# Patient Record
Sex: Female | Born: 1975 | Race: Black or African American | Hispanic: No | Smoking: Current every day smoker
Health system: Southern US, Community
[De-identification: ages and names within clinical notes are randomized; demographics above are authoritative.]

## PROBLEM LIST (undated history)

## (undated) DIAGNOSIS — L732 Hidradenitis suppurativa: Secondary | ICD-10-CM

---

## 2005-03-24 ENCOUNTER — Emergency Department (HOSPITAL_COMMUNITY): Admission: EM | Admit: 2005-03-24 | Discharge: 2005-03-24 | Payer: Self-pay | Admitting: Emergency Medicine

## 2005-10-03 ENCOUNTER — Emergency Department (HOSPITAL_COMMUNITY): Admission: EM | Admit: 2005-10-03 | Discharge: 2005-10-04 | Payer: Self-pay | Admitting: Emergency Medicine

## 2005-10-05 ENCOUNTER — Emergency Department (HOSPITAL_COMMUNITY): Admission: EM | Admit: 2005-10-05 | Discharge: 2005-10-05 | Payer: Self-pay | Admitting: Emergency Medicine

## 2006-06-23 ENCOUNTER — Emergency Department (HOSPITAL_COMMUNITY): Admission: EM | Admit: 2006-06-23 | Discharge: 2006-06-23 | Payer: Self-pay | Admitting: Emergency Medicine

## 2006-08-08 ENCOUNTER — Emergency Department (HOSPITAL_COMMUNITY): Admission: EM | Admit: 2006-08-08 | Discharge: 2006-08-08 | Payer: Self-pay | Admitting: Emergency Medicine

## 2006-12-11 ENCOUNTER — Emergency Department (HOSPITAL_COMMUNITY): Admission: EM | Admit: 2006-12-11 | Discharge: 2006-12-11 | Payer: Self-pay | Admitting: Emergency Medicine

## 2007-11-16 ENCOUNTER — Emergency Department (HOSPITAL_COMMUNITY): Admission: EM | Admit: 2007-11-16 | Discharge: 2007-11-16 | Payer: Self-pay | Admitting: Family Medicine

## 2008-02-15 ENCOUNTER — Emergency Department (HOSPITAL_COMMUNITY): Admission: EM | Admit: 2008-02-15 | Discharge: 2008-02-15 | Payer: Self-pay | Admitting: Family Medicine

## 2008-02-17 ENCOUNTER — Emergency Department (HOSPITAL_COMMUNITY): Admission: EM | Admit: 2008-02-17 | Discharge: 2008-02-17 | Payer: Self-pay | Admitting: Family Medicine

## 2008-10-17 ENCOUNTER — Emergency Department (HOSPITAL_COMMUNITY): Admission: EM | Admit: 2008-10-17 | Discharge: 2008-10-17 | Payer: Self-pay | Admitting: Emergency Medicine

## 2008-11-09 ENCOUNTER — Emergency Department (HOSPITAL_COMMUNITY): Admission: EM | Admit: 2008-11-09 | Discharge: 2008-11-09 | Payer: Self-pay | Admitting: Emergency Medicine

## 2010-02-04 ENCOUNTER — Emergency Department (HOSPITAL_COMMUNITY): Admission: EM | Admit: 2010-02-04 | Discharge: 2010-01-20 | Payer: Self-pay | Admitting: Emergency Medicine

## 2010-04-26 ENCOUNTER — Emergency Department (HOSPITAL_COMMUNITY)
Admission: EM | Admit: 2010-04-26 | Discharge: 2010-04-27 | Discharge status: Against Medical Advice | Payer: Self-pay | Attending: Emergency Medicine | Admitting: Emergency Medicine

## 2010-04-26 DIAGNOSIS — Z0389 Encounter for observation for other suspected diseases and conditions ruled out: Secondary | ICD-10-CM | POA: Insufficient documentation

## 2010-06-04 LAB — URINE MICROSCOPIC-ADD ON

## 2010-06-04 LAB — COMPREHENSIVE METABOLIC PANEL
AST: 17 U/L (ref 0–37)
Alkaline Phosphatase: 53 U/L (ref 39–117)
CO2: 25 mEq/L (ref 19–32)
Calcium: 8.6 mg/dL (ref 8.4–10.5)
GFR calc Af Amer: >60 mL/min (ref >60)
GFR calc non Af Amer: >60 mL/min (ref >60)
Glucose, Bld: 100 mg/dL — ABNORMAL HIGH (ref 70–99)
Potassium: 4 mEq/L (ref 3.5–5.1)
Sodium: 136 mEq/L (ref 135–145)

## 2010-06-04 LAB — URINALYSIS, ROUTINE W REFLEX MICROSCOPIC
Glucose, UA: NEGATIVE mg/dL
Hgb urine dipstick: NEGATIVE
Nitrite: NEGATIVE
pH: 7 (ref 5.0–8.0)

## 2010-06-04 LAB — DIFFERENTIAL
Basophils Relative: 1 % (ref 0–1)
Lymphs Abs: 1.3 10*3/uL (ref 0.7–4.0)
Monocytes Absolute: 0.4 10*3/uL (ref 0.1–1.0)
Neutro Abs: 4.1 10*3/uL (ref 1.7–7.7)
Neutrophils Relative %: 70 % (ref 43–77)

## 2010-06-04 LAB — CBC
MCHC: 33.7 g/dL (ref 30.0–36.0)
RBC: 4.42 MIL/uL (ref 3.87–5.11)

## 2010-06-04 LAB — POCT PREGNANCY, URINE: Preg Test, Ur: NEGATIVE

## 2010-06-05 LAB — URINE MICROSCOPIC-ADD ON

## 2010-06-05 LAB — URINALYSIS, ROUTINE W REFLEX MICROSCOPIC
Nitrite: NEGATIVE
Protein, ur: NEGATIVE mg/dL
Urobilinogen, UA: 1 mg/dL (ref 0.0–1.0)

## 2010-06-05 LAB — PREGNANCY, URINE: Preg Test, Ur: NEGATIVE

## 2010-11-09 ENCOUNTER — Inpatient Hospital Stay (INDEPENDENT_AMBULATORY_CARE_PROVIDER_SITE_OTHER)
Admission: RE | Admit: 2010-11-09 | Discharge: 2010-11-09 | Disposition: A | Discharge status: Home / Self Care | Payer: Self-pay | Source: Ambulatory Visit | Attending: Emergency Medicine | Admitting: Emergency Medicine

## 2010-11-09 DIAGNOSIS — L089 Local infection of the skin and subcutaneous tissue, unspecified: Secondary | ICD-10-CM

## 2010-11-09 DIAGNOSIS — L989 Disorder of the skin and subcutaneous tissue, unspecified: Secondary | ICD-10-CM

## 2010-11-29 LAB — GC/CHLAMYDIA PROBE AMP, GENITAL
Chlamydia, DNA Probe: NEGATIVE
GC Probe Amp, Genital: NEGATIVE

## 2010-11-29 LAB — POCT URINALYSIS DIP (DEVICE)
Bilirubin Urine: NEGATIVE
Glucose, UA: NEGATIVE
Hgb urine dipstick: NEGATIVE
Ketones, ur: NEGATIVE
Specific Gravity, Urine: 1.02

## 2010-11-29 LAB — WET PREP, GENITAL
Clue Cells Wet Prep HPF POC: NONE SEEN
Yeast Wet Prep HPF POC: NONE SEEN

## 2010-12-03 LAB — CULTURE, ROUTINE-ABSCESS: Culture: NO GROWTH

## 2010-12-05 ENCOUNTER — Inpatient Hospital Stay (INDEPENDENT_AMBULATORY_CARE_PROVIDER_SITE_OTHER)
Admission: RE | Admit: 2010-12-05 | Discharge: 2010-12-05 | Disposition: A | Discharge status: Home / Self Care | Payer: Self-pay | Source: Ambulatory Visit | Attending: Emergency Medicine | Admitting: Emergency Medicine

## 2010-12-05 DIAGNOSIS — M799 Soft tissue disorder, unspecified: Secondary | ICD-10-CM

## 2010-12-09 LAB — POCT PREGNANCY, URINE: Preg Test, Ur: NEGATIVE

## 2010-12-09 LAB — URINE MICROSCOPIC-ADD ON

## 2010-12-09 LAB — URINALYSIS, ROUTINE W REFLEX MICROSCOPIC
Bilirubin Urine: NEGATIVE
Glucose, UA: NEGATIVE
Ketones, ur: 15 — AB
Specific Gravity, Urine: 1.028

## 2011-08-11 ENCOUNTER — Emergency Department (HOSPITAL_COMMUNITY)
Admission: EM | Admit: 2011-08-11 | Discharge: 2011-08-11 | Disposition: A | Discharge status: Home / Self Care | Payer: Self-pay | Attending: Emergency Medicine | Admitting: Emergency Medicine

## 2011-08-11 ENCOUNTER — Encounter (HOSPITAL_COMMUNITY): Payer: Self-pay | Admitting: Emergency Medicine

## 2011-08-11 DIAGNOSIS — L259 Unspecified contact dermatitis, unspecified cause: Secondary | ICD-10-CM | POA: Insufficient documentation

## 2011-08-11 DIAGNOSIS — R21 Rash and other nonspecific skin eruption: Secondary | ICD-10-CM

## 2011-08-11 MED ORDER — DIPHENHYDRAMINE HCL 25 MG PO TABS: 25.0000 mg | ORAL_TABLET | Freq: Four times a day (QID) | ORAL | Status: AC

## 2011-08-11 MED ORDER — METHYLPREDNISOLONE SODIUM SUCC 125 MG IJ SOLR
125.0000 mg | Freq: Once | INTRAMUSCULAR | Status: AC
  Administered 2011-08-11: 125 mg via INTRAMUSCULAR
  Filled 2011-08-11: qty 2

## 2011-08-11 NOTE — ED Notes (Signed)
PT. REPORTS ITCHY FACIAL RASH FOR 3 DAYS , RESPIRATIONS UNLABORED.

## 2011-08-11 NOTE — ED Notes (Signed)
Pt stated that she was in the ED 3 days ago. After leaving she stated that a rash began on her face and neck. The rash increased over the last few days. No pain, no drainage, no redness or swelling. Only itching. A few bumps. On neck face. Pt applied cream to assist with itching at home. No cardiac or respiratory distress. Will continue to monitor.

## 2011-08-11 NOTE — ED Provider Notes (Signed)
History     CSN: [id number]  Arrival date & time 08/11/11  2044   First MD Initiated Contact with Patient 08/11/11 2244      Chief Complaint  Patient presents with  . Rash   HPI  History provided by the patient. Patient is a 36 year old female with no significant past medical history who presents with complaints of pleuritic rash to bilateral face and neck area for the past 3 days. Symptoms began over the right neck area and chin. Patient now has itching and rash to bilateral cheeks and neck area. Patient is unsure what may have caused rash. She has not had similar rash on the face in the past. Patient does state that is possible she could of gotten it from one of her dogs. 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 Patient has been putting a poison ivy blocking cream on the face without any improvements. She has not done anything else for her symptoms. She denies any swelling of the lips, tongue, throat. She denies any difficulty breathing or swallowing. She denies any swelling around the eyes. She denies rash anywhere else. She denies any new makeup, facial lotions, soaps, clothing or jewelry.    History reviewed. No pertinent past medical history.  History reviewed. No pertinent past surgical history.  No family history on file.  History  Substance Use Topics  . Smoking status: Current Everyday Smoker  . Smokeless tobacco: Not on file  . Alcohol Use: No    OB History    Grav Para Term Preterm Abortions TAB SAB Ect Mult Living                  Review of Systems  Constitutional: Negative for fever and chills.  HENT: Negative for trouble swallowing.   Eyes: Negative for visual disturbance.  Respiratory: Negative for shortness of breath.   Skin: Positive for rash.  Neurological: Negative for headaches.    Allergies  Review of patient's allergies indicates no known allergies.  Home Medications  No current outpatient  prescriptions on file.  BP 82/62  Pulse 91  Temp 98 F (36.7 C) (Oral)  Resp 20  SpO2 99%  LMP 07/31/2011  Physical Exam  Nursing note and vitals reviewed. Constitutional: She is oriented to person, place, and time. She appears well-developed and well-nourished. No distress.  HENT:  Head: Normocephalic and atraumatic.  Neck: Normal range of motion. Neck supple.  Cardiovascular: Normal rate and regular rhythm.   Pulmonary/Chest: Effort normal. No stridor. No respiratory distress. She has wheezes. She has no rales.       Very slight inspiratory wheeze  Neurological: She is alert and oriented to person, place, and time.  Skin: Skin is warm and dry.       Mild erythematous macular papular rash in a regular patches on the right lateral neck, right cheek area, chin and left jaw pain and neck.   Psychiatric: She has a normal mood and affect. Her behavior is normal.    ED Course  Procedures     1. Rash   2. Contact dermatitis       MDM  Patient seen and evaluated. Patient in no acute distress.        Angus Seller, [place] 08/12/11 [phone]

## 2011-08-11 NOTE — Discharge Instructions (Signed)
You were seen and evaluated for your rash on her face. At this time your providers are unsure exactly of the cause of your rash but it is possibly rash could be caused from a contact dermatitis from poison ivy or other substance. Your given one dose of steroids to help with your rash. You may continue to use Benadryl to help with itching symptoms. Please followup with the primary care provider or dermatology specialist for continued evaluation if your symptoms persist or worsen. You develop any difficulty breathing or swallowing, swelling of your lips are common please return to the emergency room.   Contact Dermatitis Contact dermatitis is a reaction to certain substances that touch the skin. Contact dermatitis can be either irritant contact dermatitis or allergic contact dermatitis. Irritant contact dermatitis does not require previous exposure to the substance for a reaction to occur.Allergic contact dermatitis only occurs if you have been exposed to the substance before. Upon a repeat exposure, your body reacts to the substance.  CAUSES  Many substances can cause contact dermatitis. Irritant dermatitis is most commonly caused by repeated exposure to mildly irritating substances, such as:  Makeup.   Soaps.   Detergents.   Bleaches.   Acids.   Metal salts, such as nickel.  Allergic contact dermatitis is most commonly caused by exposure to:  Poisonous plants.   Chemicals (deodorants, shampoos).   Jewelry.   Latex.   Neomycin in triple antibiotic cream.   Preservatives in products, including clothing.  SYMPTOMS  The area of skin that is exposed may develop:  Dryness or flaking.   Redness.   Cracks.   Itching.   Pain or a burning sensation.   Blisters.  With allergic contact dermatitis, there may also be swelling in areas such as the eyelids, mouth, or genitals.  DIAGNOSIS  Your caregiver can usually tell what the problem is by doing a physical exam. In cases where the  cause is uncertain and an allergic contact dermatitis is suspected, a patch skin test may be performed to help determine the cause of your dermatitis. TREATMENT Treatment includes protecting the skin from further contact with the irritating substance by avoiding that substance if possible. Barrier creams, powders, and gloves may be helpful. Your caregiver may also recommend:  Steroid creams or ointments applied 2 times daily. For best results, soak the rash area in cool water for 20 minutes. Then apply the medicine. Cover the area with a plastic wrap. You can store the steroid cream in the refrigerator for a "chilly" effect on your rash. That may decrease itching. Oral steroid medicines may be needed in more severe cases.   Antibiotics or antibacterial ointments if a skin infection is present.   Antihistamine lotion or an antihistamine taken by mouth to ease itching.   Lubricants to keep moisture in your skin.   Burow's solution to reduce redness and soreness or to dry a weeping rash. Mix one packet or tablet of solution in 2 cups cool water. Dip a clean washcloth in the mixture, wring it out a bit, and put it on the affected area. Leave the cloth in place for 30 minutes. Do this as often as possible throughout the day.   Taking several cornstarch or baking soda baths daily if the area is too large to cover with a washcloth.  Harsh chemicals, such as alkalis or acids, can cause skin damage that is like a burn. You should flush your skin for 15 to 20 minutes with cold water after such  an exposure. You should also seek immediate medical care after exposure. Bandages (dressings), antibiotics, and pain medicine may be needed for severely irritated skin.  HOME CARE INSTRUCTIONS  Avoid the substance that caused your reaction.   Keep the area of skin that is affected away from hot water, soap, sunlight, chemicals, acidic substances, or anything else that would irritate your skin.   Do not scratch the  rash. Scratching may cause the rash to become infected.   You may take cool baths to help stop the itching.   Only take over-the-counter or prescription medicines as directed by your caregiver.   See your caregiver for follow-up care as directed to make sure your skin is healing properly.  SEEK MEDICAL CARE IF:   Your condition is not better after 3 days of treatment.   You seem to be getting worse.   You see signs of infection such as swelling, tenderness, redness, soreness, or warmth in the affected area.   You have any problems related to your medicines.  Document Released: 02/12/2000 Document Revised: 02/03/2011 Document Reviewed: 07/20/2010 [facility] Patient Information 2012 [place], [place].   Rash A rash is a change in the color or texture of your skin. There are many different types of rashes. You may have other problems that accompany your rash. CAUSES   Infections.   Allergic reactions. This can include allergies to pets or foods.   Certain medicines.   Exposure to certain chemicals, soaps, or cosmetics.   Heat.   Exposure to poisonous plants.   Tumors, both cancerous and noncancerous.  SYMPTOMS   Redness.   Scaly skin.   Itchy skin.   Dry or cracked skin.   Bumps.   Blisters.   Pain.  DIAGNOSIS  Your caregiver may do a physical exam to determine what type of rash you have. A skin sample (biopsy) may be taken and examined under a microscope. TREATMENT  Treatment depends on the type of rash you have. Your caregiver may prescribe certain medicines. For serious conditions, you may need to see a skin doctor (dermatologist). HOME CARE INSTRUCTIONS   Avoid the substance that caused your rash.   Do not scratch your rash. This can cause infection.   You may take cool baths to help stop itching.   Only take over-the-counter or prescription medicines as directed by your caregiver.   Keep all follow-up appointments as directed by your caregiver.  SEEK  IMMEDIATE MEDICAL CARE IF:  You have increasing pain, swelling, or redness.   You have a fever.   You have new or severe symptoms.   You have body aches, diarrhea, or vomiting.   Your rash is not better after 3 days.  MAKE SURE YOU:  Understand these instructions.   Will watch your condition.   Will get help right away if you are not doing well or get worse.  Document Released: 02/04/2002 Document Revised: 02/03/2011 Document Reviewed: 11/29/2010 [facility] Patient Information 2012 [place], [place].

## 2011-08-12 NOTE — ED Provider Notes (Signed)
Medical screening examination/treatment/procedure(s) were performed by non-physician practitioner and as supervising physician I was immediately available for consultation/collaboration.    Shataya Winkles R Mae Denunzio, MD 08/12/11 2110 

## 2012-05-13 ENCOUNTER — Emergency Department (HOSPITAL_COMMUNITY)
Admission: EM | Admit: 2012-05-13 | Discharge: 2012-05-14 | Disposition: A | Discharge status: Home / Self Care | Payer: Self-pay | Attending: Emergency Medicine | Admitting: Emergency Medicine

## 2012-05-13 ENCOUNTER — Encounter (HOSPITAL_COMMUNITY): Payer: Self-pay

## 2012-05-13 DIAGNOSIS — L02215 Cutaneous abscess of perineum: Secondary | ICD-10-CM

## 2012-05-13 DIAGNOSIS — Z872 Personal history of diseases of the skin and subcutaneous tissue: Secondary | ICD-10-CM | POA: Insufficient documentation

## 2012-05-13 DIAGNOSIS — L02219 Cutaneous abscess of trunk, unspecified: Secondary | ICD-10-CM | POA: Insufficient documentation

## 2012-05-13 DIAGNOSIS — L03319 Cellulitis of trunk, unspecified: Secondary | ICD-10-CM | POA: Insufficient documentation

## 2012-05-13 DIAGNOSIS — F17200 Nicotine dependence, unspecified, uncomplicated: Secondary | ICD-10-CM | POA: Insufficient documentation

## 2012-05-13 HISTORY — DX: Hidradenitis suppurativa: L73.2

## 2012-05-13 NOTE — ED Provider Notes (Signed)
History     CSN: [id number]  Arrival date & time 05/13/12  2302   First MD Initiated Contact with Patient 05/13/12 2330      No chief complaint on file.   (Consider location/radiation/quality/duration/timing/severity/associated sxs/prior treatment) HPI Comments: 37 year old female with a history of hidradenitis presents to the emergency department complaining of a "boil to her vagina" that she noticed 3 days ago. The area has been getting larger over the past few days and she does notice some discharge from it causing an odor. Denies vaginal discharge, pain, bleeding or menstrual problem. Denies increased urinary frequency, urgency, dysuria or hematuria. No fever or chills. States she normally gets abscesses throughout her body, however has never had one in this area. Despite triage summary patient is not concerned about sexually transmitted diseases. She is sexually active with one partner and always uses condoms.  The history is provided by the patient.    Past Medical History  Diagnosis Date  . Hidradenitis     History reviewed. No pertinent past surgical history.  No family history on file.  History  Substance Use Topics  . Smoking status: Current Every Day Smoker -- 0.50 packs/day    Types: Cigarettes  . Smokeless tobacco: Never Used  . Alcohol Use: Yes    OB History   Grav Para Term Preterm Abortions TAB SAB Ect Mult Living                  Review of Systems  Constitutional: Negative for fever and chills.  Gastrointestinal: Negative for nausea, vomiting and anal bleeding.  Genitourinary: Negative for dysuria, urgency, frequency, hematuria, vaginal bleeding, vaginal discharge, vaginal pain and menstrual problem.  Skin:       Positive for "boil"  All other systems reviewed and are negative.    Allergies  Review of patient's allergies indicates no known allergies.  Home Medications   Current Outpatient Rx  Name  Route  Sig  Dispense  Refill  . EXPIRED:  diphenhydrAMINE (BENADRYL) 25 MG tablet   Oral   Take 1 tablet (25 mg total) by mouth every 6 (six) hours.   20 tablet   0     BP 135/89  Pulse 80  Temp(Src) 98.2 F (36.8 C) (Oral)  Resp 16  SpO2 99%  LMP 05/05/2012  Physical Exam  Nursing note and vitals reviewed. Constitutional: She is oriented to person, place, and time. She appears well-developed and well-nourished. No distress.  HENT:  Head: Normocephalic and atraumatic.  Mouth/Throat: Oropharynx is clear and moist.  Eyes: Conjunctivae are normal.  Neck: Normal range of motion. Neck supple.  Cardiovascular: Normal rate, regular rhythm and normal heart sounds.   Pulmonary/Chest: Effort normal and breath sounds normal. No respiratory distress.  Abdominal: Soft. Bowel sounds are normal. There is no tenderness.  Genitourinary:    There is no rash, tenderness or lesion on the right labia. There is no rash, tenderness or lesion on the left labia.  Musculoskeletal: Normal range of motion. She exhibits no edema.  Neurological: She is alert and oriented to person, place, and time.  Skin: Skin is warm and dry.  Psychiatric: She has a normal mood and affect. Her behavior is normal.    ED Course  Procedures (including critical care time) INCISION AND DRAINAGE Performed by: Johnnette Gourd Consent: Verbal consent obtained. Risks and benefits: risks, benefits and alternatives were discussed Type: abscess  Body area: perineum  Anesthesia: local infiltration  Incision was made with a scalpel.  Local  anesthetic: lidocaine 2% with epinephrine  Anesthetic total: 6 ml  Complexity: simple Blunt dissection to break up loculations  Drainage: purulent  Drainage amount: medium  Patient tolerance: Patient tolerated the procedure well with no immediate complications.    Labs Reviewed - No data to display No results found.   1. Abscess, perineum       MDM  37 y/o female with perineal abscess. Abscess not in area of  Bartholin's gland. She is having no vaginal discharge or pain. No surrounding cellulitis. Abscess drained and patient states decreased pain to that area. Stable for discharge. Return precautions discussed. Patient states understanding of plan and is agreeable.        Trevor Mace, PA-C 05/14/12 0003

## 2012-05-13 NOTE — ED Notes (Signed)
Patient presents with c/o boil to vagina. First noticed it about 3-4 days ago. Has hx hidradenitis and has had multiple boils to various parts of her body. Has never had one on her vagina. Doesn't know if it's that or a STD. Patient is sexually active. Uses condoms. Denies fevers, sweats or chills. Denies hematuria, dysuria, urgency or frequency. No vaginal discharge. Endorses vaginal odor. LMP 05/05/12.

## 2012-05-14 NOTE — ED Provider Notes (Signed)
Medical screening examination/treatment/procedure(s) were performed by non-physician practitioner and as supervising physician I was immediately available for consultation/collaboration.  Lyanne Co, MD 05/14/12 [phone]

## 2014-01-27 ENCOUNTER — Encounter (HOSPITAL_COMMUNITY): Payer: Self-pay | Admitting: *Deleted

## 2014-01-27 ENCOUNTER — Emergency Department (HOSPITAL_COMMUNITY)
Admission: EM | Admit: 2014-01-27 | Discharge: 2014-01-27 | Disposition: A | Discharge status: Home / Self Care | Payer: Self-pay | Attending: Emergency Medicine | Admitting: Emergency Medicine

## 2014-01-27 DIAGNOSIS — L732 Hidradenitis suppurativa: Secondary | ICD-10-CM | POA: Insufficient documentation

## 2014-01-27 DIAGNOSIS — M79675 Pain in left toe(s): Secondary | ICD-10-CM | POA: Insufficient documentation

## 2014-01-27 DIAGNOSIS — Z720 Tobacco use: Secondary | ICD-10-CM | POA: Insufficient documentation

## 2014-01-27 DIAGNOSIS — M79672 Pain in left foot: Secondary | ICD-10-CM | POA: Insufficient documentation

## 2014-01-27 MED ORDER — LIDOCAINE-EPINEPHRINE (PF) 2 %-1:200000 IJ SOLN
10.0000 mL | Freq: Once | INTRAMUSCULAR | Status: DC
  Filled 2014-01-27: qty 20

## 2014-01-27 MED ORDER — IBUPROFEN 400 MG PO TABS
800.0000 mg | ORAL_TABLET | Freq: Once | ORAL | Status: AC
  Administered 2014-01-27: 800 mg via ORAL
  Filled 2014-01-27: qty 2

## 2014-01-27 MED ORDER — IBUPROFEN 800 MG PO TABS: 800.0000 mg | ORAL_TABLET | Freq: Three times a day (TID) | ORAL | Status: DC

## 2014-01-27 MED ORDER — SULFAMETHOXAZOLE-TRIMETHOPRIM 800-160 MG PO TABS: 1.0000 | ORAL_TABLET | Freq: Two times a day (BID) | ORAL | Status: AC

## 2014-01-27 MED ORDER — MINOCYCLINE HCL 100 MG PO CAPS: 100.0000 mg | ORAL_CAPSULE | Freq: Two times a day (BID) | ORAL | Status: DC

## 2014-01-27 MED ORDER — CEPHALEXIN 500 MG PO CAPS: 500.0000 mg | ORAL_CAPSULE | Freq: Two times a day (BID) | ORAL | Status: DC

## 2014-01-27 NOTE — ED Provider Notes (Signed)
CSN: [id number]     Arrival date & time 01/27/14  1502 History  This chart was scribed for non-physician practitioner, Fayrene HelperBowie Vermell Madrid, PA-C working with Geoffery Lyonsouglas Delo, MD by Greggory StallionKayla Andersen, ED scribe. This patient was seen in room TR11C/TR11C and the patient's care was started at 4:08 PM.   Chief Complaint  Patient presents with  . Abscess  . Foot Pain   The history is provided by the patient. No language interpreter was used.    HPI Comments: Rebecca Fritz is a 38 y.o. female who presents to the Emergency Department complaining of worsening, throbbing left second and third toe pain that started 2 days ago. States there is a small bump on her foot. Denies injury. Denies changing shoe.  No hx of diabetes.  Bearing weight worsens the pain and causes it to be sharp. Denies ankle pain. Pt is also complaining of an abscess to her left groin that started several months. Reports history of hidradenitis. She has done warm compresses with no relief.   Past Medical History  Diagnosis Date  . Hidradenitis    History reviewed. No pertinent past surgical history. History reviewed. No pertinent family history. History  Substance Use Topics  . Smoking status: Current Every Day Smoker -- 0.50 packs/day    Types: Cigarettes  . Smokeless tobacco: Never Used  . Alcohol Use: Yes   OB History    No data available     Review of Systems  Musculoskeletal: Positive for arthralgias.  Skin:       Abscess  All other systems reviewed and are negative.  Allergies  Review of patient's allergies indicates no known allergies.  Home Medications   Prior to Admission medications   Medication Sig Start Date End Date Taking? Authorizing Provider  diphenhydrAMINE (BENADRYL) 25 MG tablet Take 1 tablet (25 mg total) by mouth every 6 (six) hours. 08/11/11 09/10/11  Phill MutterPeter S Dammen, PA-C   BP 116/66 mmHg  Pulse 92  Temp(Src) 97.8 F (36.6 C) (Oral)  Resp 18  SpO2 97%  LMP 01/12/2014  Physical Exam   Constitutional: She is oriented to person, place, and time. She appears well-developed and well-nourished. No distress.  HENT:  Head: Normocephalic and atraumatic.  Eyes: Conjunctivae and EOM are normal.  Neck: Neck supple. No tracheal deviation present.  Cardiovascular: Normal rate.   Pulmonary/Chest: Effort normal. No respiratory distress.  Musculoskeletal: Normal range of motion.  Tenderness noted to the ball of the left foot along the second and third toe without any deformity. No overlying skin changes. No foreign object. Pedal pulses palpable on the left. Normal skin tones. Sensation intact. Ankle normal.   Neurological: She is alert and oriented to person, place, and time.  Skin: Skin is warm and dry.  Abscess noted to left inguinal fold on the medial side. Tender to palpation. No surrounding erythema.   Psychiatric: She has a normal mood and affect. Her behavior is normal.  Nursing note and vitals reviewed.   ED Course  Procedures (including critical care time)  DIAGNOSTIC STUDIES: Oxygen Saturation is 97% on RA, normal by my interpretation.    COORDINATION OF CARE: 4:12 PM-Discussed treatment plan which includes an antibiotic with pt at bedside and pt agreed to plan. Will give pt a referral [facility] a podiatrist [facility] and advised her to follow up.   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 Doubt gout or septic joint  Pt currently does not want I&D.  Agrees to use  warm compress and abx.  Return precaution discussed.  5:39 PM i initially prescribed minocycline however pt prefers abx on $4 list, therefore will prescribe keflex and bactrim instead  Labs Review Labs Reviewed - No data to display  Imaging Review No results found.   EKG Interpretation None      MDM   Final diagnoses:  Left foot pain  Hidradenitis suppurativa    BP 116/66 mmHg  Pulse 92  Temp(Src) 97.8 F (36.6 C) (Oral)  Resp 18  SpO2 97%  LMP 01/12/2014   I  personally performed the services described in this documentation, which was scribed in my presence. The recorded information has been reviewed and is accurate.  Fayrene HelperBowie Deziah Renwick, PA-C 01/27/14 1648  Fayrene HelperBowie Janesha Brissette, PA-C 01/27/14 1740  Geoffery Lyonsouglas Delo, MD 01/28/14 [phone]

## 2014-01-27 NOTE — ED Notes (Signed)
C/o pain between 2nd and 3rd toes of left foot. Small open wound noted. ALSO, c/o abscess left groin. Hx of same.

## 2014-01-27 NOTE — ED Notes (Signed)
Pt in c/o abscess on the inside of her left thigh, reports drainage, also c/o pain to left second toe since yesterday, unsure of injury, pain with walking

## 2014-01-27 NOTE — Discharge Instructions (Signed)
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 Take antibiotic, apply warm compress and follow up with [facility] for outpatient treatment of your recurrent abscess  Hidradenitis Suppurativa, Sweat Gland Abscess Hidradenitis suppurativa is a long lasting (chronic), uncommon disease of the sweat glands. With this, boil-like lumps and scarring develop in the groin, some times under the arms (axillae), and under the breasts. It may also uncommonly occur behind the ears, in the crease of the buttocks, and around the genitals.  CAUSES  The cause is from a blocking of the sweat glands. They then become infected. It may cause drainage and odor. It is not contagious. So it cannot be given to someone else. It most often shows up in puberty (about 110 to 36 years of age). But it may happen much later. It is similar to acne which is a disease of the sweat glands. This condition is slightly more common in African-Americans and women. SYMPTOMS   Hidradenitis usually starts as one or more red, tender, swellings in the groin or under the arms (axilla).  Over a period of hours to days the lesions get larger. They often open to the skin surface, draining clear to yellow-colored fluid.  The infected area heals with scarring. DIAGNOSIS  Your caregiver makes this diagnosis by looking at you. Sometimes cultures (growing germs on plates in the lab) may be taken. This is to see what germ (bacterium) is causing the infection.  TREATMENT   Topical germ killing medicine applied to the skin (antibiotics) are the treatment of choice. Antibiotics taken by mouth (systemic) are sometimes needed when the condition is getting worse or is severe.  Avoid tight-fitting clothing which traps moisture in.  Dirt does not cause hidradenitis and it is not caused by poor hygiene.  Involved areas should be cleaned daily using an antibacterial soap. Some  patients find that the liquid form of Lever 2000, applied to the involved areas as a lotion after bathing, can help reduce the odor related to this condition.  Sometimes surgery is needed to drain infected areas or remove scarred tissue. Removal of large amounts of tissue is used only in severe cases.  Birth control pills may be helpful.  Oral retinoids (vitamin A derivatives) for 6 to 12 months which are effective for acne may also help this condition.  Weight loss will improve but not cure hidradenitis. It is made worse by being overweight. But the condition is not caused by being overweight.  This condition is more common in people who have had acne.  It may become worse under stress. There is no medical cure for hidradenitis. It can be controlled, but not cured. The condition usually continues for years with periods of getting worse and getting better (remission). Document Released: 09/29/2003 Document Revised: 05/09/2011 Document Reviewed: 05/17/2013 [facility] Patient Information 2015 [place], [place]. This information is not intended to replace advice given to you by your health care provider. Make sure you discuss any questions you have with your health care provider.  Morton's Neuroma in Sports  (Interdigital Plantar Neuroma) Morton's neuroma is a condition of the nervous system that results in pain or loss of feeling in the toes. The disease is caused by the bones of the foot squeezing the nerve that runs between two toes (interdigital nerve). The third and fourth toes are most likely to be affected by this disease. SYMPTOMS   Tingling, numbness, burning, or electric shocks in  the front of the foot, often involving the third and fourth toes, although it may involve any other pair of toes.  Pain and tenderness in the front of the foot, that gets worse when walking.  Pain that gets worse when pressure is applied to the foot (wearing shoes).  Severe pain in the front of the foot,  when standing on the front of the foot (on tiptoes), such as with running, jumping, pivoting, or dancing. CAUSES  Morton's neuroma is caused by swelling of the nerve between two toes. This swelling causes the nerve to be pinched between the bones of the foot. RISK INCREASES WITH:  Recurring foot or ankle injuries.  Poor fitting or worn shoes, with minimal padding and shock absorbers.  Loose ligaments of the foot, causing thickening of the nerve.  Poor foot strength and flexibility. PREVENTION  Warm up and stretch properly before activity.  Maintain physical fitness:  Foot and ankle flexibility.  Muscle strength and endurance.  Cardiovascular fitness.  Wear properly fitted and padded shoes.  Wear arch supports (orthotics), when needed. PROGNOSIS  If treated properly, Morton's neuroma can usually be cured with non-surgical treatment. For certain cases, surgery may be needed. RELATED COMPLICATIONS  Permanent numbness and pain in the foot.  Inability to participate in athletics, because of pain. TREATMENT Treatment first involves stopping any activities that make the symptoms worse. The use of ice and medicine will help reduce pain and inflammation. Wearing shoes with a wide toe box, and an orthotic arch support or metatarsal bar, may also reduce pain. Your caregiver may give you a corticosteroid injection, to further reduce inflammation. If non-surgical treatment is unsuccessful, surgery may be needed. Surgery to fix Morton's neuroma is often performed as an outpatient procedure, meaning you can go home the same day as the surgery. The procedure involves removing the source of pressure on the nerve. If it is necessary to remove the nerve, you can expect persistent numbness. MEDICATION  If pain medicine is needed, nonsteroidal anti-inflammatory medicines (aspirin and ibuprofen), or other minor pain relievers (acetaminophen), are often advised.  Do not take pain medicine for 7 days  before surgery.  Prescription pain relievers are usually prescribed only after surgery. Use only as directed and only as much as you need.  Corticosteroid injections are used in extreme cases, to reduce inflammation. These injections should be done only if necessary, because they may be given only a limited number of times. HEAT AND COLD  Cold treatment (icing) should be applied for 10 to 15 minutes every 2 to 3 hours for inflammation and pain, and immediately after activity that aggravates your symptoms. Use ice packs or an ice massage.  Heat treatment may be used before performing stretching and strengthening activities prescribed by your caregiver, physical therapist, or athletic trainer. Use a heat pack or a warm water soak. SEEK MEDICAL CARE IF:   Symptoms get worse or do not improve in 2 weeks, despite treatment.  After surgery you develop increasing pain, swelling, redness, increased warmth, bleeding, drainage of fluids, or fever.  New, unexplained symptoms develop. (Drugs used in treatment may produce side effects.) Document Released: 12/22/2004 Document Revised: 05/09/2011 Document Reviewed: 05/29/2008 [facility] Patient Information 2015 [place], [place]. This information is not intended to replace advice given to you by your health care provider. Make sure you discuss any questions you have with your health care provider.

## 2015-11-23 ENCOUNTER — Emergency Department (HOSPITAL_COMMUNITY)
Admission: EM | Admit: 2015-11-23 | Discharge: 2015-11-23 | Disposition: A | Discharge status: Home / Self Care | Payer: Self-pay | Attending: Emergency Medicine | Admitting: Emergency Medicine

## 2015-11-23 ENCOUNTER — Encounter (HOSPITAL_COMMUNITY): Payer: Self-pay | Admitting: Emergency Medicine

## 2015-11-23 DIAGNOSIS — L732 Hidradenitis suppurativa: Secondary | ICD-10-CM | POA: Insufficient documentation

## 2015-11-23 DIAGNOSIS — F17210 Nicotine dependence, cigarettes, uncomplicated: Secondary | ICD-10-CM | POA: Insufficient documentation

## 2015-11-23 MED ORDER — CEPHALEXIN 500 MG PO CAPS: 500.0000 mg | ORAL_CAPSULE | Freq: Two times a day (BID) | ORAL | 0 refills | Status: AC

## 2015-11-23 MED ORDER — SULFAMETHOXAZOLE-TRIMETHOPRIM 800-160 MG PO TABS: 1.0000 | ORAL_TABLET | Freq: Two times a day (BID) | ORAL | 0 refills | Status: AC

## 2015-11-23 NOTE — Discharge Instructions (Signed)
Take your antibiotics as prescribed until completed. I recommend continuing to do warm compresses at home for 15 minutes 3-4 times daily. Keep wounds clean using Dial antibacterial soap and water, pat dry. I recommend following up with the dermatology clinic listed above for further management of your hidradenitis. Please return to the Emergency Department if symptoms worsen or new onset of fever, redness, swelling, warmth, drainage, abdominal pain, vomiting.

## 2015-11-23 NOTE — ED Provider Notes (Signed)
[facility]-EMERGENCY DEPT Provider Note   CSN: [id number] Arrival date & time: 11/23/15  1147  By signing my name below, I, Rebecca Fritz, attest that this documentation has been prepared under the direction and in the presence of [name], [place]. Electronically Signed: Sandrea Fritz, ED Scribe. 11/23/15. 3:16 PM.    History   Chief Complaint Chief Complaint  Patient presents with  . Rash    HPI Comments: Rebecca Fritz is a 40 y.o. female who presents to the Emergency Department complaining of a skin lesions. Pt says she has had multiple similar skin lesions that typically resolve themself related to hidradenitis. She says she uses hot compresses at home for the lesions but denies recent antibiotic use. She has experienced recurrent lesions of this sort in the past that she says resolve easily but tend to return. She denies fever, vomiting, sharp abdominal pain, or dissimilar rash. Pt says she was given tetracycline by a dermatologist 10 years ago for skin lesions but is not currently followed by a specialist. Pt states she has no other symptoms or complaints.    The history is provided by the patient. No language interpreter was used.    Past Medical History:  Diagnosis Date  . Hidradenitis   . Hidradenitis     There are no active problems to display for this patient.   History reviewed. No pertinent surgical history.  OB History    No data available       Home Medications    Prior to Admission medications   Medication Sig Start Date End Date Taking? Authorizing Provider  cephALEXin (KEFLEX) 500 MG capsule Take 1 capsule (500 mg total) by mouth 2 (two) times daily. 11/23/15 11/30/15  Barrett Henle, PA-C  diphenhydrAMINE (BENADRYL) 25 MG tablet Take 1 tablet (25 mg total) by mouth every 6 (six) hours. 08/11/11 09/10/11  Ivonne Andrew, PA-C  ibuprofen (ADVIL,MOTRIN) 800 MG tablet Take 1 tablet (800 mg total) by mouth 3 (three) times daily. 01/27/14   Fayrene Helper, PA-C  minocycline (MINOCIN) 100 MG capsule Take 1 capsule (100 mg total) by mouth 2 (two) times daily. 01/27/14   Fayrene Helper, PA-C  sulfamethoxazole-trimethoprim (BACTRIM DS,SEPTRA DS) 800-160 MG tablet Take 1 tablet by mouth 2 (two) times daily. 11/23/15 11/30/15  Barrett Henle, PA-C    Family History No family history on file.  Social History Social History  Substance Use Topics  . Smoking status: Current Every Day Smoker    Packs/day: 0.50    Types: Cigarettes  . Smokeless tobacco: Never Used  . Alcohol use Yes     Allergies   Review of patient's allergies indicates no known allergies.   Review of Systems Review of Systems  Constitutional: Negative for fever.  Gastrointestinal: Negative for vomiting.  Skin: Positive for wound. Negative for rash.     Physical Exam Updated Vital Signs BP 119/82 (BP Location: Right Arm)   Pulse 81   Temp 98.5 F (36.9 C) (Oral)   Resp 18   SpO2 100%   Physical Exam  Constitutional: She appears well-developed and well-nourished. No distress.  HENT:  Head: Normocephalic and atraumatic.  Mouth/Throat: Uvula is midline, oropharynx is clear and moist and mucous membranes are normal. No oral lesions. No posterior oropharyngeal edema, posterior oropharyngeal erythema or tonsillar abscesses.  Eyes: Conjunctivae are normal. Right eye exhibits no discharge. Left eye exhibits no discharge. No scleral icterus.  Neck: Normal range of motion. Neck supple.  Cardiovascular: Normal rate, regular rhythm  and normal heart sounds.   No murmur heard. Pulmonary/Chest: Effort normal and breath sounds normal. No respiratory distress. She has no wheezes. She has no rales. She exhibits no tenderness.  Abdominal: Soft. There is no tenderness.  Musculoskeletal: She exhibits no edema.  Neurological: She is alert.  Skin: Skin is warm and dry.  Multiple healing open wounds noted to abdomen, chest, and buttocks region with no surrounding swelling,  erythema, warmth, or drainage. Mild TTP. Diffuse hyperpigmented scar tissue noted to pt's arms, abdomen, chest, back, and buttocks.  Psychiatric: She has a normal mood and affect.  Nursing note and vitals reviewed.    ED Treatments / Results   DIAGNOSTIC STUDIES: Oxygen Saturation is 100% on RA, normal by my interpretation.    COORDINATION OF CARE: 2:54 PM Discussed treatment plan with pt at bedside and pt agreed to plan.   Labs (all labs ordered are listed, but only abnormal results are displayed) Labs Reviewed - No data to display  EKG  EKG Interpretation None       Radiology No results found.  Procedures Procedures (including critical care time)  Medications Ordered in ED Medications - No data to display   Initial Impression / Assessment and Plan / ED Course  I have reviewed the triage vital signs and the nursing notes.  Pertinent labs & imaging results that were available during my care of the patient were reviewed by me and considered in my medical decision making (see chart for details).  Clinical Course    Patient presents with skin lesions consistent with flare of hidradenitis. Denies fever. Reports mild improvement of lesions with warm compresses at home. Denies currently being on any antibiotics. VSS. Exam revealed multiple healing open wounds to chest, abdomen and but ox with no evidence of surrounding cellulitis. Patient with multiple scars noted diffusely on skin exam. Lesions without induration, fluctuance or drainage and do not appear to warrant I&D at this time. Plan to discharge patient home with antibiotics and continued symptomatic treatment including warm compresses. Patient given information to follow-up with dermatologist regarding her chronic lesions associated with hidradenitis. Discussed return precautions with patient.  Final Clinical Impressions(s) / ED Diagnoses   Final diagnoses:  Hidradenitis    New Prescriptions Discharge Medication  List as of 11/23/2015  3:30 PM    START taking these medications   Details  sulfamethoxazole-trimethoprim (BACTRIM DS,SEPTRA DS) 800-160 MG tablet Take 1 tablet by mouth 2 (two) times daily., Starting Mon 11/23/2015, Until Mon 11/30/2015, Print       I personally performed the services described in this documentation, which was scribed in my presence. The recorded information has been reviewed and is accurate.      Satira Sarkicole Elizabeth Cottage CityNadeau, [place]PA-C 11/24/15 1651    Rolland PorterMark James, MD 11/28/15 2241

## 2015-11-23 NOTE — ED Triage Notes (Signed)
Has several bumps, one on side, one under breast  States some are getting bigger in various stages of reddness and drainage , all about quarter size

## 2015-11-23 NOTE — ED Notes (Signed)
Declined W/C at D/C and was escorted to lobby by RN. 

## 2015-12-05 ENCOUNTER — Emergency Department (HOSPITAL_COMMUNITY)
Admission: EM | Admit: 2015-12-05 | Discharge: 2015-12-05 | Disposition: A | Discharge status: Home / Self Care | Payer: Self-pay | Attending: Emergency Medicine | Admitting: Emergency Medicine

## 2015-12-05 ENCOUNTER — Encounter (HOSPITAL_COMMUNITY): Payer: Self-pay | Admitting: Emergency Medicine

## 2015-12-05 DIAGNOSIS — Z792 Long term (current) use of antibiotics: Secondary | ICD-10-CM | POA: Insufficient documentation

## 2015-12-05 DIAGNOSIS — F17210 Nicotine dependence, cigarettes, uncomplicated: Secondary | ICD-10-CM | POA: Insufficient documentation

## 2015-12-05 DIAGNOSIS — Z79899 Other long term (current) drug therapy: Secondary | ICD-10-CM | POA: Insufficient documentation

## 2015-12-05 DIAGNOSIS — B3731 Acute candidiasis of vulva and vagina: Secondary | ICD-10-CM

## 2015-12-05 DIAGNOSIS — B373 Candidiasis of vulva and vagina: Secondary | ICD-10-CM | POA: Insufficient documentation

## 2015-12-05 LAB — WET PREP, GENITAL
SPERM: NONE SEEN
Trich, Wet Prep: NONE SEEN
YEAST WET PREP: NONE SEEN

## 2015-12-05 LAB — URINALYSIS, ROUTINE W REFLEX MICROSCOPIC
Bilirubin Urine: NEGATIVE
GLUCOSE, UA: NEGATIVE mg/dL
Hgb urine dipstick: NEGATIVE
KETONES UR: NEGATIVE mg/dL
Nitrite: NEGATIVE
PROTEIN: NEGATIVE mg/dL
Specific Gravity, Urine: 1.025 (ref 1.005–1.030)
pH: 6.5 (ref 5.0–8.0)

## 2015-12-05 LAB — POC URINE PREG, ED: PREG TEST UR: NEGATIVE

## 2015-12-05 LAB — URINE MICROSCOPIC-ADD ON: RBC / HPF: NONE SEEN RBC/hpf (ref 0–5)

## 2015-12-05 MED ORDER — FLUCONAZOLE 150 MG PO TABS
150.0000 mg | ORAL_TABLET | Freq: Once | ORAL | Status: AC
  Administered 2015-12-05: 150 mg via ORAL
  Filled 2015-12-05: qty 1

## 2015-12-05 MED ORDER — FLUCONAZOLE 150 MG PO TABS: ORAL_TABLET | ORAL | 0 refills | Status: DC

## 2015-12-05 NOTE — ED Notes (Signed)
Pt reports being on abx for the last week and now having vaginal discharge along with itching and burning.

## 2015-12-05 NOTE — ED Provider Notes (Signed)
[facility]-EMERGENCY DEPT Provider Note: Lowella Dell, MD, FACEP  CSN: [id number] MRN: [id number] ARRIVAL: 12/05/15 at 0212   CHIEF COMPLAINT  Vaginal Discharge   HISTORY OF PRESENT ILLNESS  Rebecca Fritz is a 40 y.o. female who was recently placed on Keflex and Bactrim for treatment of hidradenitis upper teeth up. For the past 2 days she has had a profuse white vaginal discharge with itching of the vulva and vagina. She has no history of yeast infections due to antibiotics in the past. She denies abdominal pain. She has tried an over-the-counter cream without relief.   Past Medical History:  Diagnosis Date  . Hidradenitis   . Hidradenitis     History reviewed. No pertinent surgical history.  No family history on file.  Social History  Substance Use Topics  . Smoking status: Current Every Day Smoker    Packs/day: 0.50    Types: Cigarettes  . Smokeless tobacco: Never Used  . Alcohol use No    Prior to Admission medications   Medication Sig Start Date End Date Taking? Authorizing Provider  acetaminophen (TYLENOL) 500 MG tablet Take 500-1,000 mg by mouth every 6 (six) hours as needed for moderate pain.   Yes Historical Provider, MD  cephALEXin (KEFLEX) 500 MG capsule Take 500 mg by mouth 2 (two) times daily.   Yes Historical Provider, MD  sulfamethoxazole-trimethoprim (BACTRIM DS,SEPTRA DS) 800-160 MG tablet Take 1 tablet by mouth 2 (two) times daily.   Yes Historical Provider, MD  diphenhydrAMINE (BENADRYL) 25 MG tablet Take 1 tablet (25 mg total) by mouth every 6 (six) hours. 08/11/11 09/10/11  Ivonne Andrew, PA-C    Allergies Review of patient's allergies indicates no known allergies.   REVIEW OF SYSTEMS  Negative except as noted here or in the History of Present Illness.   PHYSICAL EXAMINATION  Initial Vital Signs Blood pressure 124/83, pulse 73, temperature 98.1 F (36.7 C), temperature source Oral, resp. rate 20, height 5\' 6"  (1.676 m), weight 192 lb (87.1 kg),  SpO2 100 %.  Examination General: Well-developed, well-nourished female in no acute distress; appearance consistent with age of record HENT: normocephalic; atraumatic Eyes: Normal appearance Neck: supple Heart: regular rate and rhythm Lungs: clear to auscultation bilaterally Abdomen: soft; nondistended; nontender; no masses or hepatosplenomegaly; bowel sounds present GU: Inflammation of vulvovaginal mucosa; white, curd-like vaginal discharge; no cervical motion tenderness Extremities: No deformity; full range of motion; pulses normal Neurologic: Awake, alert and oriented; motor function intact in all extremities and symmetric; no facial droop Skin: Warm and dry Psychiatric: Normal mood and affect   RESULTS  Summary of this visit's results, reviewed by myself:   EKG Interpretation  Date/Time:    Ventricular Rate:    PR Interval:    QRS Duration:   QT Interval:    QTC Calculation:   R Axis:     Text Interpretation:        Laboratory Studies: Results for orders placed or performed during the hospital encounter of 12/05/15 (from the past 24 hour(s))  Urinalysis, Routine w reflex microscopic- may I&O cath if menses     Status: Abnormal   Collection Time: 12/05/15  4:47 AM  Result Value Ref Range   Color, Urine YELLOW YELLOW   APPearance CLOUDY (A) CLEAR   Specific Gravity, Urine 1.025 1.005 - 1.030   pH 6.5 5.0 - 8.0   Glucose, UA NEGATIVE NEGATIVE mg/dL   Hgb urine dipstick NEGATIVE NEGATIVE   Bilirubin Urine NEGATIVE NEGATIVE   Ketones, ur  NEGATIVE NEGATIVE mg/dL   Protein, ur NEGATIVE NEGATIVE mg/dL   Nitrite NEGATIVE NEGATIVE   Leukocytes, UA MODERATE (A) NEGATIVE  Urine microscopic-add on     Status: Abnormal   Collection Time: 12/05/15  4:47 AM  Result Value Ref Range   Squamous Epithelial / LPF 0-5 (A) NONE SEEN   WBC, UA 0-5 0 - 5 WBC/hpf   RBC / HPF NONE SEEN 0 - 5 RBC/hpf   Bacteria, UA FEW (A) NONE SEEN   Urine-Other AMORPHOUS URATES/PHOSPHATES   POC  urine preg, ED     Status: None   Collection Time: 12/05/15  4:51 AM  Result Value Ref Range   Preg Test, Ur NEGATIVE NEGATIVE  Wet prep, genital     Status: Abnormal   Collection Time: 12/05/15  6:33 AM  Result Value Ref Range   Yeast Wet Prep HPF POC NONE SEEN NONE SEEN   Trich, Wet Prep NONE SEEN NONE SEEN   Clue Cells Wet Prep HPF POC PRESENT (A) NONE SEEN   WBC, Wet Prep HPF POC MANY (A) NONE SEEN   Sperm NONE SEEN    Imaging Studies: No results found.  ED COURSE  Nursing notes and initial vitals signs, including pulse oximetry, reviewed.  Vitals:   12/05/15 0231 12/05/15 0507 12/05/15 0618  BP: 119/78 124/83   Pulse: 84 73   Resp: 16 20   Temp: 97.9 F (36.6 C) 98.1 F (36.7 C)   TempSrc: Oral Oral   SpO2: 99% 100%   Weight:   192 lb (87.1 kg)  Height:   5\' 6"  (1.676 m)    PROCEDURES    ED DIAGNOSES     ICD-9-CM ICD-10-CM   1. Candidal vulvovaginitis 112.1 B37.3        Paula LibraJohn Kamalei Roeder, MD 12/05/15 [phone]

## 2015-12-05 NOTE — ED Triage Notes (Signed)
Pt from home with complaints of itching in her groin. Pt states she had had unprotected sex that was painful. Pt states she has "milky" vaginal discharge that has began today. Pt has been taking antibiotics for folliculitis

## 2015-12-07 LAB — GC/CHLAMYDIA PROBE AMP (~~LOC~~) NOT AT ARMC
CHLAMYDIA, DNA PROBE: NEGATIVE
NEISSERIA GONORRHEA: NEGATIVE

## 2016-05-24 ENCOUNTER — Encounter (HOSPITAL_COMMUNITY): Payer: Self-pay | Admitting: Emergency Medicine

## 2016-05-24 DIAGNOSIS — F17210 Nicotine dependence, cigarettes, uncomplicated: Secondary | ICD-10-CM | POA: Insufficient documentation

## 2016-05-24 DIAGNOSIS — A599 Trichomoniasis, unspecified: Secondary | ICD-10-CM | POA: Insufficient documentation

## 2016-05-24 DIAGNOSIS — N72 Inflammatory disease of cervix uteri: Secondary | ICD-10-CM | POA: Insufficient documentation

## 2016-05-24 DIAGNOSIS — N760 Acute vaginitis: Secondary | ICD-10-CM | POA: Insufficient documentation

## 2016-05-24 NOTE — ED Triage Notes (Signed)
Pt presents with vaginal spotting; states she missed her period in feb; reporting a pressure in the vaginal area and pain to right sided suprapubic area; pt denies urinary changes; denies n/v/d

## 2016-05-25 ENCOUNTER — Emergency Department (HOSPITAL_COMMUNITY)
Admission: EM | Admit: 2016-05-25 | Discharge: 2016-05-25 | Disposition: A | Discharge status: Home / Self Care | Payer: Self-pay | Attending: Emergency Medicine | Admitting: Emergency Medicine

## 2016-05-25 DIAGNOSIS — N760 Acute vaginitis: Secondary | ICD-10-CM

## 2016-05-25 DIAGNOSIS — A599 Trichomoniasis, unspecified: Secondary | ICD-10-CM

## 2016-05-25 DIAGNOSIS — B9689 Other specified bacterial agents as the cause of diseases classified elsewhere: Secondary | ICD-10-CM

## 2016-05-25 DIAGNOSIS — N72 Inflammatory disease of cervix uteri: Secondary | ICD-10-CM

## 2016-05-25 LAB — WET PREP, GENITAL
SPERM: NONE SEEN
Yeast Wet Prep HPF POC: NONE SEEN

## 2016-05-25 LAB — POC URINE PREG, ED: Preg Test, Ur: NEGATIVE

## 2016-05-25 MED ORDER — METRONIDAZOLE 500 MG PO TABS: 500.0000 mg | ORAL_TABLET | Freq: Two times a day (BID) | ORAL | 0 refills | Status: DC

## 2016-05-25 MED ORDER — METRONIDAZOLE 500 MG PO TABS
500.0000 mg | ORAL_TABLET | Freq: Once | ORAL | Status: AC
  Administered 2016-05-25: 500 mg via ORAL
  Filled 2016-05-25: qty 1

## 2016-05-25 MED ORDER — STERILE WATER FOR INJECTION IJ SOLN
INTRAMUSCULAR | Status: AC
  Administered 2016-05-25: 2 mL
  Filled 2016-05-25: qty 10

## 2016-05-25 MED ORDER — AZITHROMYCIN 250 MG PO TABS
1000.0000 mg | ORAL_TABLET | Freq: Once | ORAL | Status: AC
  Administered 2016-05-25: 1000 mg via ORAL
  Filled 2016-05-25: qty 4

## 2016-05-25 MED ORDER — CEFTRIAXONE SODIUM 250 MG IJ SOLR
250.0000 mg | Freq: Once | INTRAMUSCULAR | Status: AC
  Administered 2016-05-25: 250 mg via INTRAMUSCULAR
  Filled 2016-05-25: qty 250

## 2016-05-25 NOTE — ED Notes (Signed)
Pt stable, understands discharge instructions, and reasons for return.   

## 2016-05-25 NOTE — Discharge Instructions (Addendum)
Today your examination revealed that you have to condition.  Bacterial vaginosis and trichomonas.  Flagyl will take care of both of these, but make sure to take all of the medication, you should use a barrier form of birth control until test of cure. You've also been treated for potential gonorrhea and Chlamydia with IM Rocephin and by mouth azithromycin.  If these tests are positive, he will be notified to notify any sexual partners

## 2016-05-25 NOTE — ED Provider Notes (Signed)
[facility]PT Provider Note   CSN: [id number] Arrival date & time: 05/24/16  2321     History   Chief Complaint Chief Complaint  Patient presents with  . Possible Pregnancy    HPI Rebecca Fritz is a 41 y.o. female.  Patient states that she missed a menstrual cycle in February and now she is spotting.  She did home pregnancy test which is negative, but she wanted to make sure.  Denies any vaginal discharge, abdominal pain, nausea, breast tenderness.      Past Medical History:  Diagnosis Date  . Hidradenitis   . Hidradenitis     There are no active problems to display for this patient.   History reviewed. No pertinent surgical history.  OB History    No data available       Home Medications    Prior to Admission medications   Medication Sig Start Date End Date Taking? Authorizing Provider  acetaminophen (TYLENOL) 500 MG tablet Take 500-1,000 mg by mouth every 6 (six) hours as needed for moderate pain.    Historical Provider, MD  cephALEXin (KEFLEX) 500 MG capsule Take 500 mg by mouth 2 (two) times daily.    Historical Provider, MD  diphenhydrAMINE (BENADRYL) 25 MG tablet Take 1 tablet (25 mg total) by mouth every 6 (six) hours. 08/11/11 09/10/11  Ivonne AndrewPeter Dammen, PA-C  fluconazole (DIFLUCAN) 150 MG tablet Take tablet on 12/08/2015 if symptoms of yeast infection persist. 12/05/15   Paula LibraJohn Molpus, MD  sulfamethoxazole-trimethoprim (BACTRIM DS,SEPTRA DS) 800-160 MG tablet Take 1 tablet by mouth 2 (two) times daily.    Historical Provider, MD    Family History History reviewed. No pertinent family history.  Social History Social History  Substance Use Topics  . Smoking status: Current Every Day Smoker    Packs/day: 0.50    Types: Cigarettes  . Smokeless tobacco: Never Used  . Alcohol use No     Allergies   Patient has no known allergies.   Review of Systems Review of Systems  Constitutional: Negative for chills and fever.  Gastrointestinal:  Negative for abdominal pain, nausea and vomiting.  Genitourinary: Positive for vaginal bleeding and vaginal pain. Negative for dysuria, frequency, pelvic pain and urgency.  All other systems reviewed and are negative.    Physical Exam Updated Vital Signs BP 125/80   Pulse 87   Temp 98.5 F (36.9 C) (Oral)   LMP 03/26/2016   SpO2 99%   Physical Exam  Constitutional: She appears well-developed and well-nourished. No distress.  HENT:  Head: Normocephalic.  Eyes: Pupils are equal, round, and reactive to light.  Neck: Normal range of motion.  Cardiovascular: Normal rate.   Pulmonary/Chest: Effort normal.  Abdominal: Soft. Bowel sounds are normal. She exhibits no distension. There is no tenderness.  Genitourinary: Vagina normal. Cervix exhibits discharge. Right adnexum displays no tenderness. Left adnexum displays no tenderness.  Skin: Skin is warm and dry.  Psychiatric: She has a normal mood and affect.  Nursing note and vitals reviewed.    ED Treatments / Results  Labs (all labs ordered are listed, but only abnormal results are displayed) Labs Reviewed  WET PREP, GENITAL  POC URINE PREG, ED  GC/CHLAMYDIA PROBE AMP ([place]    EKG  EKG Interpretation None       Radiology No results found.  Procedures Procedures (including critical care time)  Medications Ordered in ED Medications - No data to display   Initial Impression / Assessment and Plan /  ED Course  I have reviewed the triage vital signs and the nursing notes.  Pertinent labs & imaging results that were available during my care of the patient were reviewed by me and considered in my medical decision making (see chart for details).        Final Clinical Impressions(s) / ED Diagnoses   Final diagnoses:  None    New Prescriptions New Prescriptions   No medications on file     Earley Favor, NP 05/25/16 0127    Pricilla Loveless, MD 05/28/16 1547

## 2016-05-26 LAB — GC/CHLAMYDIA PROBE AMP (~~LOC~~) NOT AT ARMC
Chlamydia: NEGATIVE
Neisseria Gonorrhea: NEGATIVE

## 2016-09-13 ENCOUNTER — Encounter (HOSPITAL_COMMUNITY): Payer: Self-pay | Admitting: Emergency Medicine

## 2016-09-13 ENCOUNTER — Emergency Department (HOSPITAL_COMMUNITY)
Admission: EM | Admit: 2016-09-13 | Discharge: 2016-09-13 | Disposition: A | Discharge status: Home / Self Care | Payer: Self-pay | Attending: Emergency Medicine | Admitting: Emergency Medicine

## 2016-09-13 ENCOUNTER — Emergency Department (HOSPITAL_COMMUNITY): Payer: Self-pay

## 2016-09-13 ENCOUNTER — Encounter (HOSPITAL_COMMUNITY): Payer: Self-pay

## 2016-09-13 DIAGNOSIS — F17210 Nicotine dependence, cigarettes, uncomplicated: Secondary | ICD-10-CM | POA: Insufficient documentation

## 2016-09-13 DIAGNOSIS — R21 Rash and other nonspecific skin eruption: Secondary | ICD-10-CM

## 2016-09-13 DIAGNOSIS — Z79899 Other long term (current) drug therapy: Secondary | ICD-10-CM | POA: Insufficient documentation

## 2016-09-13 DIAGNOSIS — M79674 Pain in right toe(s): Secondary | ICD-10-CM | POA: Insufficient documentation

## 2016-09-13 DIAGNOSIS — L299 Pruritus, unspecified: Secondary | ICD-10-CM | POA: Insufficient documentation

## 2016-09-13 MED ORDER — INDOMETHACIN 25 MG PO CAPS: 50.0000 mg | ORAL_CAPSULE | Freq: Three times a day (TID) | ORAL | 0 refills | Status: DC

## 2016-09-13 MED ORDER — INDOMETHACIN 25 MG PO CAPS
50.0000 mg | ORAL_CAPSULE | Freq: Once | ORAL | Status: AC
  Administered 2016-09-13: 50 mg via ORAL
  Filled 2016-09-13: qty 2

## 2016-09-13 MED ORDER — EPINEPHRINE 0.3 MG/0.3ML IJ SOAJ: 0.3000 mg | Freq: Once | INTRAMUSCULAR | 0 refills | Status: AC

## 2016-09-13 MED ORDER — LORATADINE 10 MG PO TABS: 10.0000 mg | ORAL_TABLET | Freq: Every day | ORAL | 0 refills | Status: DC

## 2016-09-13 MED ORDER — PREDNISONE 20 MG PO TABS
60.0000 mg | ORAL_TABLET | Freq: Once | ORAL | Status: AC
  Administered 2016-09-13: 60 mg via ORAL
  Filled 2016-09-13: qty 3

## 2016-09-13 MED ORDER — PREDNISONE 10 MG (21) PO TBPK: ORAL_TABLET | Freq: Every day | ORAL | 0 refills | Status: DC

## 2016-09-13 MED ORDER — HYDROXYZINE HCL 25 MG PO TABS: 25.0000 mg | ORAL_TABLET | Freq: Four times a day (QID) | ORAL | 0 refills | Status: DC

## 2016-09-13 NOTE — ED Notes (Signed)
Pt will be coming from xray.

## 2016-09-13 NOTE — ED Notes (Signed)
Pt reports hives after taking medication in ED this afternoon. Hives present on arms,neck, back, and legs. No respiratory distress.

## 2016-09-13 NOTE — ED Triage Notes (Signed)
Per Pt, Pt is coming from home with complaints of right foot pain that started Monday morning. Pt is unaware how she injured the foot, but she believes she woke up in the middle of the night and stubbed her toe. Reports pain and swelling that has gotten increasingly worse over the last two days.

## 2016-09-13 NOTE — ED Provider Notes (Signed)
[facility] Provider Note   CSN: [id number] Arrival date & time: 09/13/16  1847  By signing my name below, I, Rosario AdieWilliam Andrew Hiatt, attest that this documentation has been prepared under the direction and in the presence of Sharen Hecklaudia Naliya Gish, PA-C.  Electronically Signed: Rosario AdieWilliam Andrew Hiatt, ED Scribe. 09/13/16. 9:01 PM.  History   Chief Complaint Chief Complaint  Patient presents with  . Medication Reaction   The history is provided by the patient. No language interpreter was used.    HPI Comments: Rebecca Fritz is a 41 y.o. female who presents to the Emergency Department complaining of sudden onset, persistent, pruritic rash beginning a few hours after receiving "some medication" in ED earlier today. Per pt, last night she woke up with atraumatic right foot pain. She was seen in the ED for this this morning and dx'd w/ Gout. She was prescribed Indomethacin and given her first dose while in the ED approximately 9 hours ago. Shortly after being discharged she noticed her rash. No new soaps, lotions, detergents, foods, animals, plants otherwise. She took Benadryl at home without significant relief of her symptoms at home. She denies facial swelling, fever, shortness of breath, or any other associated symptoms. She notes her lips felt weird and became concerned.  Past Medical History:  Diagnosis Date  . Hidradenitis   . Hidradenitis    There are no active problems to display for this patient.  History reviewed. No pertinent surgical history.  OB History    No data available     Home Medications    Prior to Admission medications   Medication Sig Start Date End Date Taking? Authorizing Provider  acetaminophen (TYLENOL) 500 MG tablet Take 500-1,000 mg by mouth every 6 (six) hours as needed for moderate pain.    [provider]  cephALEXin (KEFLEX) 500 MG capsule Take 500 mg by mouth 2 (two) times daily.    [provider]  diphenhydrAMINE (BENADRYL) 25  MG tablet Take 1 tablet (25 mg total) by mouth every 6 (six) hours. 08/11/11 09/10/11  Ivonne Andrewammen, Peter, PA-C  EPINEPHrine 0.3 mg/0.3 mL IJ SOAJ injection Inject 0.3 mLs (0.3 mg total) into the muscle once. 09/13/16 09/13/16  Liberty HandyGibbons, [place] Kenley J, PA-C  fluconazole (DIFLUCAN) 150 MG tablet Take tablet on 12/08/2015 if symptoms of yeast infection persist. 12/05/15   Molpus, John, MD  hydrOXYzine (ATARAX/VISTARIL) 25 MG tablet Take 1 tablet (25 mg total) by mouth every 6 (six) hours. 09/13/16   Liberty HandyGibbons, Daleyza Gadomski J, PA-C  indomethacin (INDOCIN) 25 MG capsule Take 2 capsules (50 mg total) by mouth 3 (three) times daily with meals. Take 2 capsules by mouth 3 times daily with meals until pain becomes intolerable. Been take 1 capsule by mouth every 8 hours as needed. 09/13/16   Ward, Chase PicketJaime Pilcher, PA-C  loratadine (CLARITIN) 10 MG tablet Take 1 tablet (10 mg total) by mouth daily. 09/13/16   Liberty HandyGibbons, Frank Novelo J, PA-C  metroNIDAZOLE (FLAGYL) 500 MG tablet Take 1 tablet (500 mg total) by mouth 2 (two) times daily. 05/25/16   Earley FavorSchulz, Gail, NP  predniSONE (STERAPRED UNI-PAK 21 TAB) 10 MG (21) TBPK tablet Take by mouth daily. Take 6 tabs by mouth daily  for 2 days, then 5 tabs for 2 days, then 4 tabs for 2 days, then 3 tabs for 2 days, 2 tabs for 2 days, then 1 tab by mouth daily for 2 days 09/13/16   Liberty HandyGibbons, Monasia Lair J, PA-C  sulfamethoxazole-trimethoprim (BACTRIM DS,SEPTRA DS) 800-160 MG tablet Take 1  tablet by mouth 2 (two) times daily.    [provider]   Family History History reviewed. No pertinent family history.  Social History Social History  Substance Use Topics  . Smoking status: Current Every Day Smoker    Packs/day: 0.50    Types: Cigarettes  . Smokeless tobacco: Never Used  . Alcohol use No   Allergies   Indomethacin  Review of Systems Review of Systems  Constitutional: Negative for fever.  HENT: Negative for facial swelling.   Respiratory: Negative for shortness of breath.   Skin: Positive  for rash.  All other systems reviewed and are negative.  Physical Exam Updated Vital Signs BP (!) 130/93 (BP Location: Right Arm)   Pulse 89   Temp 98.9 F (37.2 C) (Oral)   Resp 18   LMP 08/14/2016 (Approximate)   SpO2 99%   Physical Exam  Constitutional: She is oriented to person, place, and time. She appears well-developed and well-nourished. No distress.  HENT:  Head: Normocephalic and atraumatic.  Right Ear: External ear normal.  Left Ear: External ear normal.  Nose: Nose normal.  Mouth/Throat: Oropharynx is clear and moist. No oropharyngeal exudate.  No intraoral lesions. No tongue swelling. Oral airway is patent.   Eyes: Pupils are equal, round, and reactive to light. Conjunctivae and EOM are normal.  Neck: Normal range of motion. Neck supple.  Pulmonary/Chest: No stridor. No respiratory distress.  Abdominal: She exhibits no distension. There is no tenderness. There is no rebound.  Neurological: She is alert and oriented to person, place, and time. She has normal reflexes. She exhibits normal muscle tone. Coordination normal.  Skin: Skin is warm. Rash noted. Rash is macular, papular and urticarial. She is not diaphoretic. No erythema.  Raised, maculopapular rash diffusely over anterior/posteior trunk, upper extremities, and thighs.   Nursing note and vitals reviewed.  ED Treatments / Results  DIAGNOSTIC STUDIES: Oxygen Saturation is 99% on RA, normal by my interpretation.   COORDINATION OF CARE: 9:01 PM-Discussed next steps with pt. Pt verbalized understanding and is agreeable with the plan.   Labs (all labs ordered are listed, but only abnormal results are displayed) Labs Reviewed - No data to display  EKG  EKG Interpretation None      Radiology Dg Foot Complete Right  Result Date: 09/13/2016 CLINICAL DATA:  Swelling, pain EXAM: RIGHT FOOT COMPLETE - 3+ VIEW COMPARISON:  None available FINDINGS: There is no evidence of fracture or dislocation. There is no  evidence of arthropathy or other focal bone abnormality. Soft tissues are unremarkable. IMPRESSION: No acute osseous finding Electronically Signed   By: Judie Petit.  Shick M.D.   On: 09/13/2016 10:20    Procedures Procedures   Medications Ordered in ED Medications  predniSONE (DELTASONE) tablet 60 mg (not administered)    Initial Impression / Assessment and Plan / ED Course  I have reviewed the triage vital signs and the nursing notes.  Pertinent labs & imaging results that were available during my care of the patient were reviewed by me and considered in my medical decision making (see chart for details).     Rash is consistent with urticaria, most likely d/t Indomethacin allergy. Patient denies any difficulty breathing or swallowing. Pt has a patent airway without stridor and is handling secretions without difficulty; no angioedema. No blisters, no pustules, no warmth, no draining sinus tracts, no superficial abscesses, no bullous impetigo, no vesicles, no desquamation, no target lesions with dusky purpura or a central bulla. Not tender to touch.  No concern for superimposed infection. No concern for SJS, TEN, TSS, tick borne illness, syphilis or other life-threatening condition. Will discharge home with short course of steroids, loratadine and recommend Benadryl as needed for pruritis.She was given rx for epi pen and instructed on s/s of anaphylaxis reaction. She is aware of symptoms that would warrant return to ED for re-eval.  Final Clinical Impressions(s) / ED Diagnoses   Final diagnoses:  Rash   New Prescriptions New Prescriptions   EPINEPHRINE 0.3 MG/0.3 ML IJ SOAJ INJECTION    Inject 0.3 mLs (0.3 mg total) into the muscle once.   HYDROXYZINE (ATARAX/VISTARIL) 25 MG TABLET    Take 1 tablet (25 mg total) by mouth every 6 (six) hours.   LORATADINE (CLARITIN) 10 MG TABLET    Take 1 tablet (10 mg total) by mouth daily.   PREDNISONE (STERAPRED UNI-PAK 21 TAB) 10 MG (21) TBPK TABLET    Take by  mouth daily. Take 6 tabs by mouth daily  for 2 days, then 5 tabs for 2 days, then 4 tabs for 2 days, then 3 tabs for 2 days, 2 tabs for 2 days, then 1 tab by mouth daily for 2 days   I personally performed the services described in this documentation, which was scribed in my presence. The recorded information has been reviewed and is accurate.     Liberty Handy, PA-C 09/13/16 2146    Charlynne Pander, MD 09/13/16 [phone]

## 2016-09-13 NOTE — ED Notes (Signed)
Pt is in stable condition upon d/c and is escorted from ED via wheelchair. 

## 2016-09-13 NOTE — ED Notes (Signed)
Called pt with no answer.

## 2016-09-13 NOTE — Discharge Instructions (Signed)
It was my pleasure taking care of you today!   Take indomethacin as directed. If symptoms do not improve in the next 3 days, please call the podiatrist listed to schedule a follow-up appointment.  Return to the emergency department for new or worsening symptoms, any additional concerns.

## 2016-09-13 NOTE — ED Triage Notes (Signed)
Pt states she was seen today for foot pain and given indomethacin. After she left and went to the pharmacy, she noticed she broke out in a rash all over. Denies SOB. Pt took benadryl at home.

## 2016-09-13 NOTE — ED Notes (Signed)
Patient returned from xray.

## 2016-09-13 NOTE — ED Provider Notes (Signed)
[facility] Provider Note   CSN: [id number] Arrival date & time: 09/13/16  [id number]   By signing my name below, I, Soijett Blue, attest that this documentation has been prepared under the direction and in the presence of Elizabeth SauerJaime Ward, PA-C Electronically Signed: Soijett Blue, ED Scribe. 09/13/16. 10:50 AM.  History   Chief Complaint Chief Complaint  Patient presents with  . Toe Injury    HPI Rebecca Fritz is a 41 y.o. female who presents to the Emergency Department complaining of gradually worsening right second toe pain occurring 2 days worsening this morning. Pt reports associated right second toe swelling. Pt has tried epsom salt soaks and her son's Rx naprosyn with no relief of her symptoms. She notes that several years ago, she has similar symptoms where she wasn't evaluated for her symptoms. Pt notes that she is unsure of any injury. Pt notes that her father has had a hx of gout issues. She denies color change, wound, hx of gout, and any other symptoms. 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 She is having trouble at work due to having to be on her feet so much.     The history is provided by the patient. No language interpreter was used.    Past Medical History:  Diagnosis Date  . Hidradenitis   . Hidradenitis     There are no active problems to display for this patient.   History reviewed. No pertinent surgical history.  OB History    No data available       Home Medications    Prior to Admission medications   Medication Sig Start Date End Date Taking? Authorizing Provider  acetaminophen (TYLENOL) 500 MG tablet Take 500-1,000 mg by mouth every 6 (six) hours as needed for moderate pain.    [provider]  cephALEXin (KEFLEX) 500 MG capsule Take 500 mg by mouth 2 (two) times daily.    [provider]  diphenhydrAMINE (BENADRYL) 25 MG tablet Take 1 tablet (25 mg total) by mouth every 6 (six) hours. 08/11/11 09/10/11  Ivonne Andrewammen, Peter,  PA-C  fluconazole (DIFLUCAN) 150 MG tablet Take tablet on 12/08/2015 if symptoms of yeast infection persist. 12/05/15   Molpus, Jonny RuizJohn, MD  indomethacin (INDOCIN) 25 MG capsule Take 2 capsules (50 mg total) by mouth 3 (three) times daily with meals. Take 2 capsules by mouth 3 times daily with meals until pain becomes intolerable. Been take 1 capsule by mouth every 8 hours as needed. 09/13/16   Ward, Chase PicketJaime Pilcher, PA-C  metroNIDAZOLE (FLAGYL) 500 MG tablet Take 1 tablet (500 mg total) by mouth 2 (two) times daily. 05/25/16   Earley FavorSchulz, Gail, NP  sulfamethoxazole-trimethoprim (BACTRIM DS,SEPTRA DS) 800-160 MG tablet Take 1 tablet by mouth 2 (two) times daily.    [provider]    Family History No family history on file.  Social History Social History  Substance Use Topics  . Smoking status: Current Every Day Smoker    Packs/day: 0.50    Types: Cigarettes  . Smokeless tobacco: Never Used  . Alcohol use No     Allergies   Patient has no known allergies.   Review of Systems Review of Systems  Musculoskeletal: Positive for arthralgias (right second toe) and joint swelling (right second toe).  Skin: Negative for color change and wound.     Physical Exam Updated Vital Signs BP 132/72 (BP Location: Left Arm)   Pulse 93   Temp 98.4 F (36.9 C) (Oral)  Resp 16   Ht 5\' 6"  (1.676 m)   Wt 201 lb (91.2 kg)   LMP 08/14/2016 (Approximate)   SpO2 99%   BMI 32.44 kg/m   Physical Exam  Constitutional: She appears well-developed and well-nourished. No distress.  HENT:  Head: Normocephalic and atraumatic.  Neck: Neck supple.  Cardiovascular: Normal rate, regular rhythm and normal heart sounds.   No murmur heard. Pulmonary/Chest: Effort normal and breath sounds normal. No respiratory distress. She has no wheezes. She has no rales.  Musculoskeletal: Normal range of motion.       Right foot: There is tenderness and swelling.  TTP along 2nd and 3rd proximal PIP joint with  associated swelling. No open wounds or redness. FROM. 2+ dp pulse. Sensation intact.   Neurological: She is alert.  Skin: Skin is warm and dry.  Nursing note and vitals reviewed.    ED Treatments / Results  DIAGNOSTIC STUDIES: Oxygen Saturation is 99% on RA, nl by my interpretation.    COORDINATION OF CARE: 10:45 AM Discussed treatment plan with pt at bedside and pt agreed to plan.   Labs (all labs ordered are listed, but only abnormal results are displayed) Labs Reviewed - No data to display  EKG  EKG Interpretation None       Radiology Dg Foot Complete Right  Result Date: 09/13/2016 CLINICAL DATA:  Swelling, pain EXAM: RIGHT FOOT COMPLETE - 3+ VIEW COMPARISON:  None available FINDINGS: There is no evidence of fracture or dislocation. There is no evidence of arthropathy or other focal bone abnormality. Soft tissues are unremarkable. IMPRESSION: No acute osseous finding Electronically Signed   By: Judie Petit.  Shick M.D.   On: 09/13/2016 10:20    Procedures Procedures (including critical care time)  Medications Ordered in ED Medications  indomethacin (INDOCIN) capsule 50 mg (50 mg Oral Given 09/13/16 1109)     Initial Impression / Assessment and Plan / ED Course  I have reviewed the triage vital signs and the nursing notes.  Pertinent labs & imaging results that were available during my care of the patient were reviewed by me and considered in my medical decision making (see chart for details).     Rebecca Fritz is a 41 y.o. female who presents to ED for pain and swelling to 2nd toe. Denies injury. Mild swelling, but no warmth or redness. Doubt infectious etiology. X-ray negative. Post-op shoe provided for comfort. Will treat with NSAID's.  Pt advised to follow up with podiatrist if symptoms persist. Patient will be discharged home & is agreeable with above plan. Returns precautions discussed. Pt appears safe for discharge.  Final Clinical Impressions(s) / ED Diagnoses    Final diagnoses:  Pain of toe of right foot    New Prescriptions Discharge Medication List as of 09/13/2016 10:52 AM    START taking these medications   Details  indomethacin (INDOCIN) 25 MG capsule Take 2 capsules (50 mg total) by mouth 3 (three) times daily with meals. Take 2 capsules by mouth 3 times daily with meals until pain becomes intolerable. Been take 1 capsule by mouth every 8 hours as needed., Starting Tue 09/13/2016, Print       I personally performed the services described in this documentation, which was scribed in my presence. The recorded information has been reviewed and is accurate.     Ward, Chase Picket, PA-C 09/13/16 1227    Alvira Monday, MD 09/14/16 1340

## 2016-09-13 NOTE — ED Notes (Signed)
ED Provider at bedside. 

## 2016-09-13 NOTE — Discharge Instructions (Signed)
Your rash is likely from a reaction to indomethicin. We will treat your urticaria (hives) with hydroxyzine for itching, prednisone for inflammatory response and loratadine.   Hydroxyzine may make you drowsy, so be cautious with driving.   Calamine lotion and/or hydrocortisone cream may help reduce itching topically.   For your foot pain, take naprosyn instead of indomethicin. STOP TAKING INDOMETHICIN. You have been given a prescription for an epi pen, use this if you develop mouth or lip swelling, difficulty breathing, feeling like you are going to pass out.  Read attached information on anaphylaxis to know symptoms to monitor for.

## 2016-11-14 ENCOUNTER — Encounter (HOSPITAL_COMMUNITY): Payer: Self-pay | Admitting: *Deleted

## 2016-11-14 ENCOUNTER — Emergency Department (HOSPITAL_COMMUNITY)
Admission: EM | Admit: 2016-11-14 | Discharge: 2016-11-14 | Disposition: A | Discharge status: Home / Self Care | Payer: No Typology Code available for payment source | Attending: Emergency Medicine | Admitting: Emergency Medicine

## 2016-11-14 DIAGNOSIS — F17210 Nicotine dependence, cigarettes, uncomplicated: Secondary | ICD-10-CM | POA: Diagnosis not present

## 2016-11-14 DIAGNOSIS — Y92410 Unspecified street and highway as the place of occurrence of the external cause: Secondary | ICD-10-CM | POA: Diagnosis not present

## 2016-11-14 DIAGNOSIS — Y998 Other external cause status: Secondary | ICD-10-CM | POA: Insufficient documentation

## 2016-11-14 DIAGNOSIS — S199XXA Unspecified injury of neck, initial encounter: Secondary | ICD-10-CM | POA: Diagnosis present

## 2016-11-14 DIAGNOSIS — Z79899 Other long term (current) drug therapy: Secondary | ICD-10-CM | POA: Insufficient documentation

## 2016-11-14 DIAGNOSIS — Y9389 Activity, other specified: Secondary | ICD-10-CM | POA: Diagnosis not present

## 2016-11-14 DIAGNOSIS — S161XXA Strain of muscle, fascia and tendon at neck level, initial encounter: Secondary | ICD-10-CM | POA: Diagnosis not present
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
## 2017-06-16 ENCOUNTER — Other Ambulatory Visit: Payer: Self-pay

## 2017-06-16 ENCOUNTER — Emergency Department (HOSPITAL_COMMUNITY)
Admission: EM | Admit: 2017-06-16 | Discharge: 2017-06-16 | Disposition: A | Discharge status: Home / Self Care | Payer: No Typology Code available for payment source | Attending: Emergency Medicine | Admitting: Emergency Medicine

## 2017-06-16 DIAGNOSIS — F17210 Nicotine dependence, cigarettes, uncomplicated: Secondary | ICD-10-CM | POA: Diagnosis not present

## 2017-06-16 DIAGNOSIS — Z79899 Other long term (current) drug therapy: Secondary | ICD-10-CM | POA: Insufficient documentation

## 2017-06-16 DIAGNOSIS — M542 Cervicalgia: Secondary | ICD-10-CM | POA: Diagnosis present

## 2017-06-16 MED ORDER — IBUPROFEN 600 MG PO TABS: 600.0000 mg | ORAL_TABLET | Freq: Four times a day (QID) | ORAL | 0 refills | Status: DC | PRN

## 2017-06-16 MED ORDER — CYCLOBENZAPRINE HCL 10 MG PO TABS: 10.0000 mg | ORAL_TABLET | Freq: Two times a day (BID) | ORAL | 0 refills | Status: AC | PRN
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
## 2017-06-16 NOTE — ED Notes (Signed)
Pt departed in NAD, refused use of wheelchair.  
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
## 2017-08-21 ENCOUNTER — Encounter (HOSPITAL_COMMUNITY): Payer: Self-pay | Admitting: Emergency Medicine

## 2017-08-21 ENCOUNTER — Emergency Department (HOSPITAL_COMMUNITY)
Admission: EM | Admit: 2017-08-21 | Discharge: 2017-08-21 | Disposition: A | Discharge status: Home / Self Care | Payer: Self-pay | Attending: Emergency Medicine | Admitting: Emergency Medicine

## 2017-08-21 ENCOUNTER — Other Ambulatory Visit: Payer: Self-pay

## 2017-08-21 DIAGNOSIS — F17210 Nicotine dependence, cigarettes, uncomplicated: Secondary | ICD-10-CM | POA: Insufficient documentation

## 2017-08-21 DIAGNOSIS — Y9389 Activity, other specified: Secondary | ICD-10-CM | POA: Insufficient documentation

## 2017-08-21 DIAGNOSIS — X500XXA Overexertion from strenuous movement or load, initial encounter: Secondary | ICD-10-CM | POA: Insufficient documentation

## 2017-08-21 DIAGNOSIS — S161XXA Strain of muscle, fascia and tendon at neck level, initial encounter: Secondary | ICD-10-CM | POA: Insufficient documentation

## 2017-08-21 DIAGNOSIS — Y990 Civilian activity done for income or pay: Secondary | ICD-10-CM | POA: Insufficient documentation

## 2017-08-21 DIAGNOSIS — Y9289 Other specified places as the place of occurrence of the external cause: Secondary | ICD-10-CM | POA: Insufficient documentation

## 2017-08-21 DIAGNOSIS — S161XXD Strain of muscle, fascia and tendon at neck level, subsequent encounter: Secondary | ICD-10-CM

## 2017-08-21 MED ORDER — IBUPROFEN 600 MG PO TABS: 600.0000 mg | ORAL_TABLET | Freq: Four times a day (QID) | ORAL | 0 refills | Status: DC | PRN
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
## 2017-08-21 NOTE — Discharge Instructions (Addendum)
Continue ibuprofen for pain and follow up with your chiropractor

## 2019-04-15 IMAGING — DX DG FOOT COMPLETE 3+V*R*
3 series · 3 of 3 positions shown · non-contrast
Comparison: None available

CLINICAL DATA: Swelling, pain

EXAM:
RIGHT FOOT COMPLETE - 3+ VIEW

[foot ap]
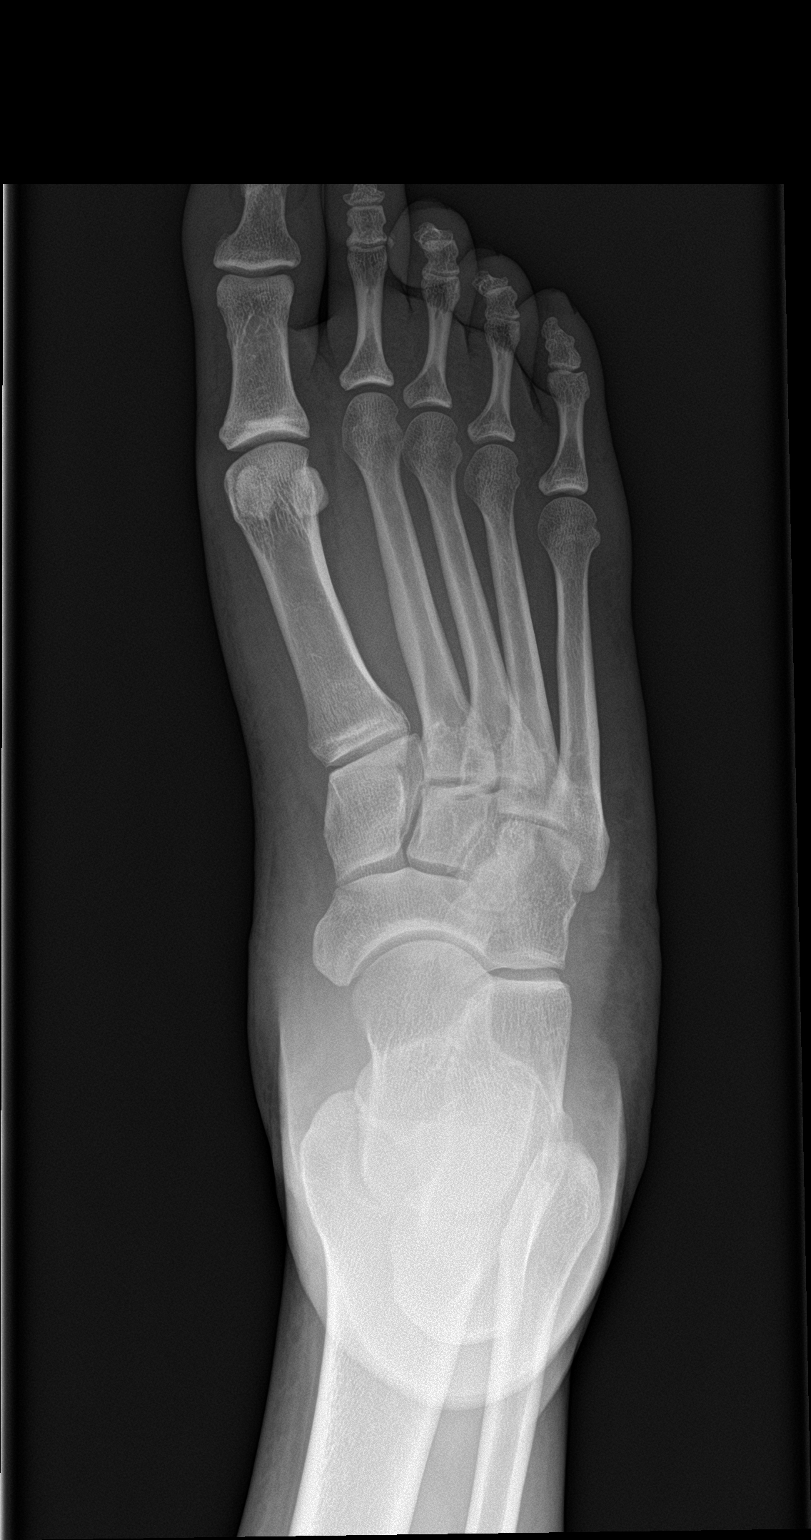

[foot obl]
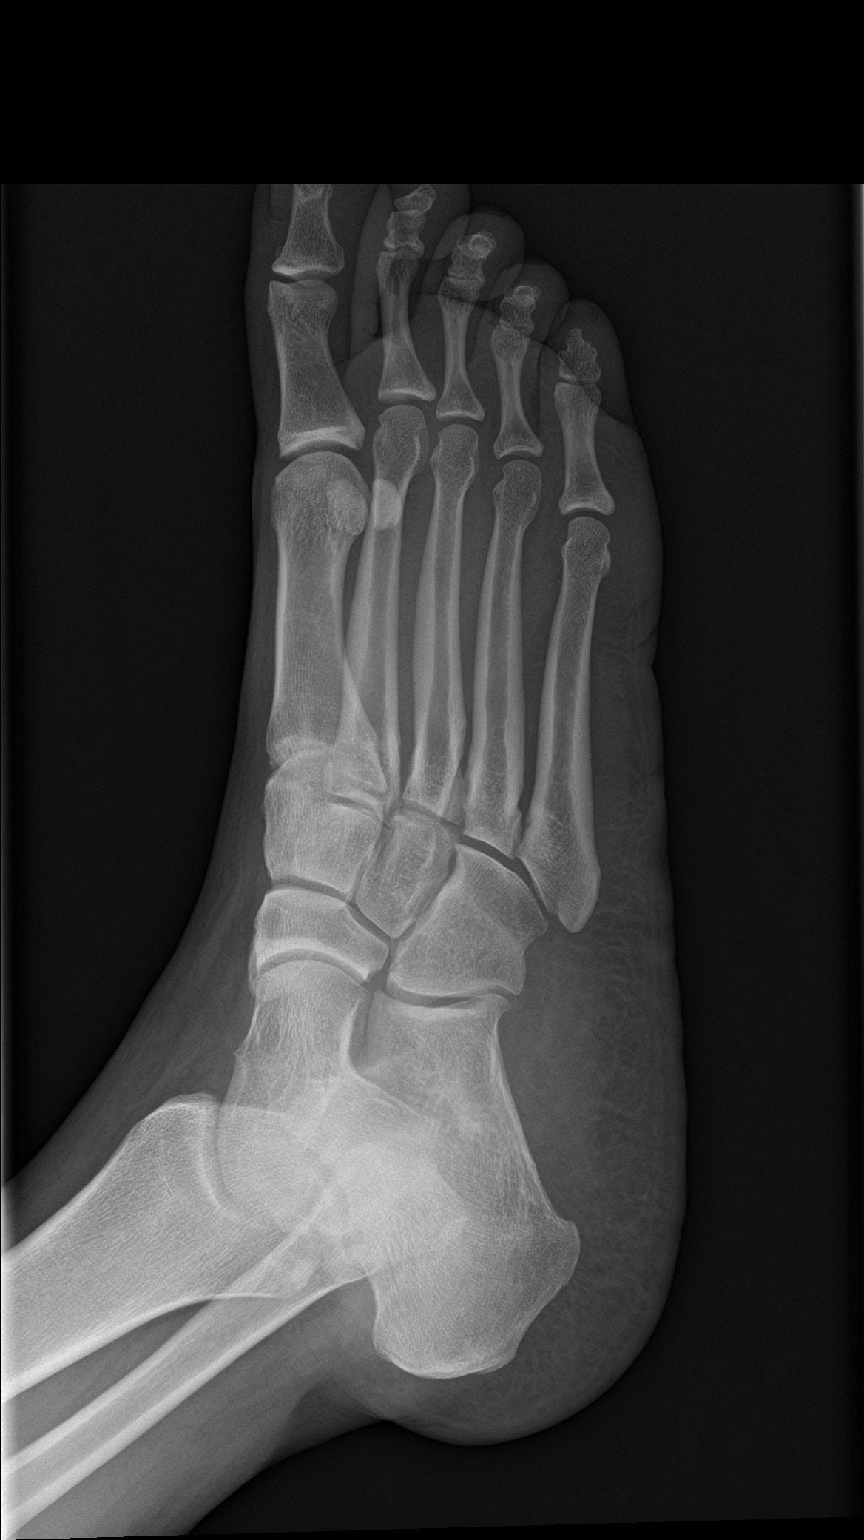

[foot lat]
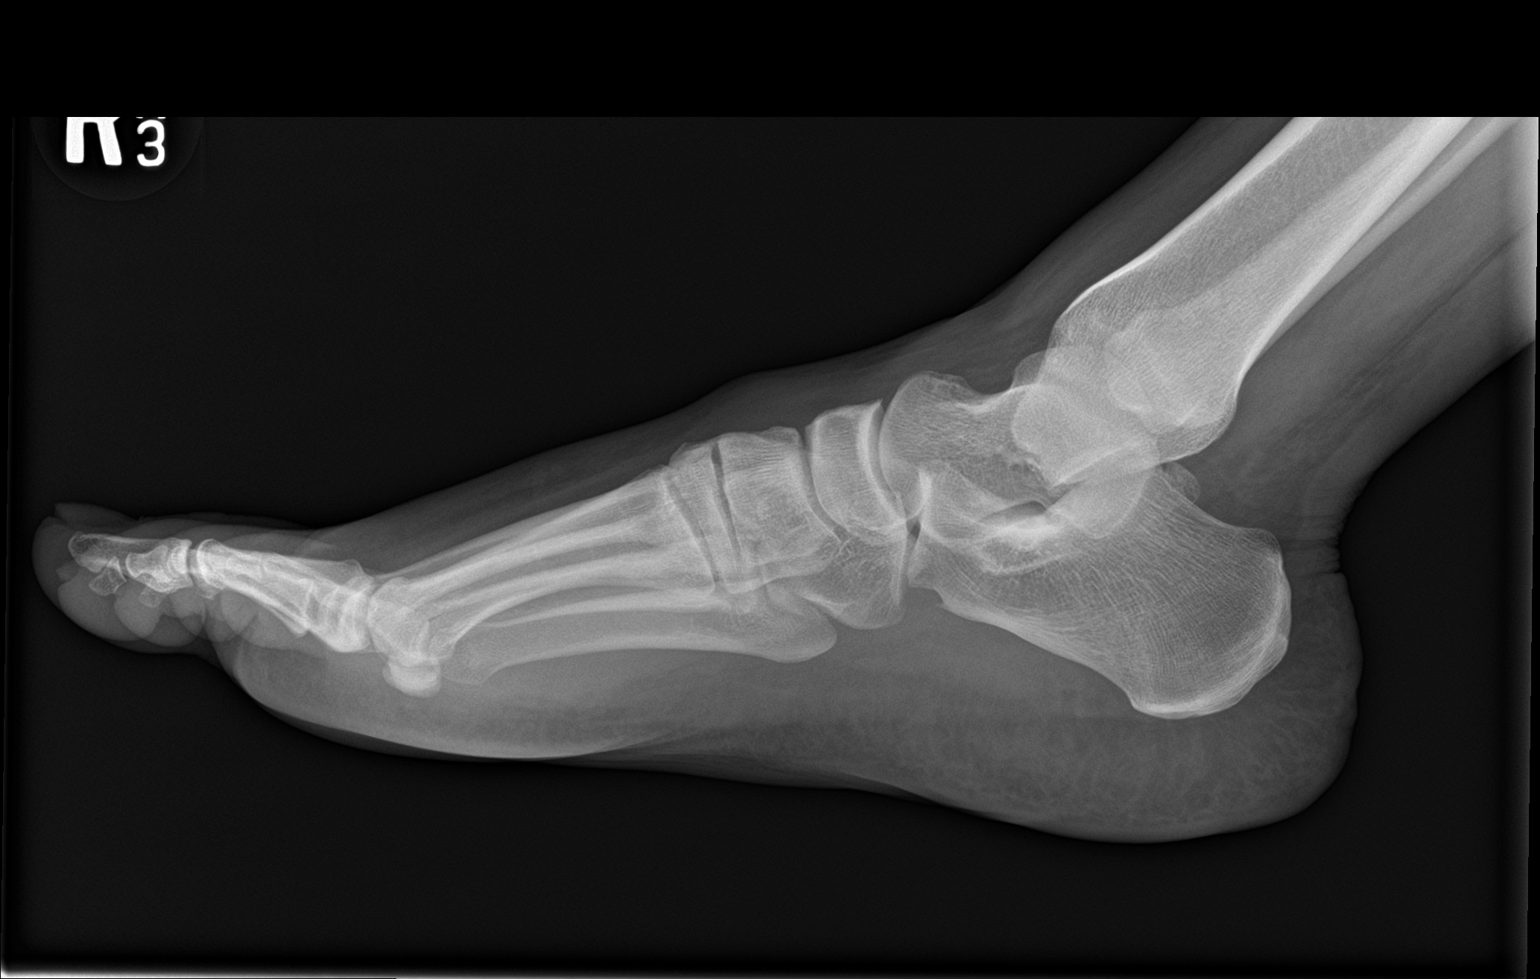

[3 of 3 positions shown; findings below may reference images not displayed]

FINDINGS: There is no evidence of fracture or dislocation. There is no
evidence of arthropathy or other focal bone abnormality. Soft
tissues are unremarkable.
IMPRESSION: No acute osseous finding

## 2020-09-10 ENCOUNTER — Emergency Department (HOSPITAL_COMMUNITY): Admission: EM | Admit: 2020-09-10 | Discharge: 2020-09-10 | Discharge status: Against Medical Advice | Payer: Self-pay

## 2020-09-10 NOTE — ED Notes (Signed)
Pt called for triage x3, no answer. 

## 2020-09-10 NOTE — ED Notes (Signed)
No answer for triage x2 

## 2020-09-11 ENCOUNTER — Emergency Department (HOSPITAL_COMMUNITY)
Admission: EM | Admit: 2020-09-11 | Discharge: 2020-09-11 | Disposition: A | Discharge status: Home / Self Care | Payer: Self-pay | Attending: Emergency Medicine | Admitting: Emergency Medicine

## 2020-09-11 ENCOUNTER — Other Ambulatory Visit: Payer: Self-pay

## 2020-09-11 DIAGNOSIS — L732 Hidradenitis suppurativa: Secondary | ICD-10-CM

## 2020-09-11 DIAGNOSIS — F17210 Nicotine dependence, cigarettes, uncomplicated: Secondary | ICD-10-CM | POA: Insufficient documentation

## 2020-09-11 DIAGNOSIS — L0291 Cutaneous abscess, unspecified: Secondary | ICD-10-CM

## 2020-09-11 MED ORDER — SULFAMETHOXAZOLE-TRIMETHOPRIM 800-160 MG PO TABS: 1.0000 | ORAL_TABLET | Freq: Two times a day (BID) | ORAL | 0 refills | Status: DC

## 2020-09-11 MED ORDER — SULFAMETHOXAZOLE-TRIMETHOPRIM 800-160 MG PO TABS
1.0000 | ORAL_TABLET | Freq: Once | ORAL | Status: AC
  Administered 2020-09-11: 1 via ORAL
  Filled 2020-09-11: qty 1

## 2020-09-11 NOTE — ED Notes (Signed)
At bedside with provider for chaperone

## 2020-09-11 NOTE — ED Provider Notes (Signed)
Emergency Medicine Provider Triage Evaluation Note  TAYLYN BRAME , a 45 y.o. female  was evaluated in triage.  Pt complains of rash.  Review of Systems  Positive: Rash, itchy, pain Negative: Fever, headache, vaginal discharge  Physical Exam  BP (!) 174/107 (BP Location: Left Arm)   Pulse 68   Temp 97.9 F (36.6 C)   Resp 16   SpO2 99%  Gen:   Awake, no distress   Resp:  Normal effort  MSK:   Moves extremities without difficulty  Other:  Multiple scattered mildly raised ulcerations noted to R breast, abdominal wall  Medical Decision Making  Medically screening exam initiated at 12:38 AM.  Appropriate orders placed.  JAKELYN SQUYRES was informed that the remainder of the evaluation will be completed by another provider, this initial triage assessment does not replace that evaluation, and the importance of remaining in the ED until their evaluation is complete.  Pt report itchy mildly painful lesions appearing on her R breast, abdominal wall and in the genital area ongoing for 1-2 weeks.  No environmental changes or new sexual partner.     Fayrene Helper, PA-C 09/11/20 0040    Gilda Crease, MD 09/11/20 [phone]

## 2020-09-11 NOTE — ED Notes (Signed)
Per pt has abscess on rt breast, lt buttock, abd, vaginal area has been in place for several weeks. Painful areas.

## 2020-09-11 NOTE — ED Triage Notes (Signed)
Pt c/o "outbreak everywhere," bilateral arms, breasts, genital area. No concern for STD exposure. States boils are at various stages of healing/draining.  8.5/10 discomfort, sharp pain in breasts

## 2020-09-11 NOTE — ED Provider Notes (Signed)
[facility] EMERGENCY DEPARTMENT Provider Note   CSN: [id number] Arrival date & time: 09/11/20  0032     History Chief Complaint  Patient presents with   Rash    Rebecca Fritz is a 45 y.o. female.  Patient presents to the emergency department for evaluation of rash.  Patient reports a previous history of hidradenitis with frequent skin infections.  Patient has had an outbreak over the last couple of weeks.  Patient reports a painful tender area on her left buttock, her abdomen, and her right breast.      Past Medical History:  Diagnosis Date   Hidradenitis    Hidradenitis     There are no problems to display for this patient.   No past surgical history on file.   OB History   No obstetric history on file.     No family history on file.  Social History   Tobacco Use   Smoking status: Every Day    Packs/day: 0.50    Types: Cigarettes   Smokeless tobacco: Never  Substance Use Topics   Alcohol use: Yes   Drug use: Yes    Types: Marijuana    Home Medications Prior to Admission medications   Medication Sig Start Date End Date Taking? Authorizing Provider  sulfamethoxazole-trimethoprim (BACTRIM DS) 800-160 MG tablet Take 1 tablet by mouth 2 (two) times daily. 09/11/20  Yes Shannah Conteh, Canary Brim, MD  acetaminophen (TYLENOL) 500 MG tablet Take 500-1,000 mg by mouth every 6 (six) hours as needed for moderate pain.    [provider]  cyclobenzaprine (FLEXERIL) 10 MG tablet Take 1 tablet (10 mg total) by mouth 2 (two) times daily as needed for muscle spasms. 06/16/17   Fayrene Helper, PA-C  diphenhydrAMINE (BENADRYL) 25 MG tablet Take 1 tablet (25 mg total) by mouth every 6 (six) hours. 08/11/11 09/10/11  Ivonne Andrew, PA-C    Allergies    Indomethacin  Review of Systems   Review of Systems  Constitutional:  Negative for fever.  Skin:  Positive for wound.   Physical Exam Updated Vital Signs BP 128/88 (BP Location: Left Arm)    Pulse (!) 58   Temp 98.9 F (37.2 C) (Oral)   Resp 18   Ht 5\' 6"  (1.676 m)   Wt 83.9 kg   SpO2 100%   BMI 29.86 kg/m   Physical Exam Vitals and nursing note reviewed.  Constitutional:      General: She is not in acute distress.    Appearance: Normal appearance. She is well-developed.  HENT:     Head: Normocephalic and atraumatic.     Right Ear: Hearing normal.     Left Ear: Hearing normal.     Nose: Nose normal.  Eyes:     Conjunctiva/sclera: Conjunctivae normal.     Pupils: Pupils are equal, round, and reactive to light.  Cardiovascular:     Rate and Rhythm: Regular rhythm.     Heart sounds: S1 normal and S2 normal. No murmur heard.   No friction rub. No gallop.  Pulmonary:     Effort: Pulmonary effort is normal. No respiratory distress.     Breath sounds: Normal breath sounds.  Chest:     Chest wall: No tenderness.  Abdominal:     General: Bowel sounds are normal.     Palpations: Abdomen is soft.     Tenderness: There is no abdominal tenderness. There is no guarding or rebound. Negative signs include Murphy's sign and McBurney's sign.  Hernia: No hernia is present.  Musculoskeletal:        General: Normal range of motion.     Cervical back: Normal range of motion and neck supple.  Skin:    General: Skin is warm and dry.     Findings: Rash present.     Comments: Diffuse hyperpigmentation on breasts, central abdomen, sequela of previous infections and scarring present.  Small active lesion right upper breast with small amount of purulent drainage, no fluctuance, induration.  Small scab on abdomen without associated erythema, fluctuance or induration.  Tender swollen area left buttock without induration.  Very slight, less than 1 cm area of possible fluctuance.  Neurological:     Mental Status: She is alert and oriented to person, place, and time.     GCS: GCS eye subscore is 4. GCS verbal subscore is 5. GCS motor subscore is 6.     Cranial Nerves: No cranial nerve  deficit.     Sensory: No sensory deficit.     Coordination: Coordination normal.  Psychiatric:        Speech: Speech normal.        Behavior: Behavior normal.        Thought Content: Thought content normal.    ED Results / Procedures / Treatments   Labs (all labs ordered are listed, but only abnormal results are displayed) Labs Reviewed - No data to display  EKG None  Radiology No results found.  Procedures Procedures   Medications Ordered in ED Medications  sulfamethoxazole-trimethoprim (BACTRIM DS) 800-160 MG per tablet 1 tablet (has no administration in time range)    ED Course  I have reviewed the triage vital signs and the nursing notes.  Pertinent labs & imaging results that were available during my care of the patient were reviewed by me and considered in my medical decision making (see chart for details).    MDM Rules/Calculators/A&P                          Patient with history of hidradenitis and multiple skin infections presents with several active lesions.  Patient has a spontaneously draining lesion on the right breast.  She has a healing, scabbed over area on abdomen.  She has an area on the left buttock that might have a small amount of purulence in it but it is palpably superficial.  Exam is difficult because there is surrounding scar tissue from previous infections.  Will recommend warm soaks and initiate antibiotic coverage, given return precautions.  Final Clinical Impression(s) / ED Diagnoses Final diagnoses:  Hidradenitis  Cutaneous abscess, unspecified site    Rx / DC Orders ED Discharge Orders          Ordered    sulfamethoxazole-trimethoprim (BACTRIM DS) 800-160 MG tablet  2 times daily        09/11/20 0437             Gilda Crease, MD 09/11/20 [phone]

## 2022-12-29 ENCOUNTER — Ambulatory Visit (HOSPITAL_COMMUNITY)
Admission: AD | Admit: 2022-12-29 | Discharge: 2022-12-29 | Disposition: A | Discharge status: Home / Self Care | Payer: Self-pay | Attending: Internal Medicine | Admitting: Internal Medicine

## 2022-12-29 ENCOUNTER — Encounter (HOSPITAL_COMMUNITY): Payer: Self-pay

## 2022-12-29 DIAGNOSIS — K047 Periapical abscess without sinus: Secondary | ICD-10-CM

## 2022-12-29 MED ORDER — AMOXICILLIN-POT CLAVULANATE 875-125 MG PO TABS: 1.0000 | ORAL_TABLET | Freq: Two times a day (BID) | ORAL | 0 refills | Status: DC

## 2022-12-29 NOTE — ED Triage Notes (Signed)
Pt reports dental pain on the right upper side of mouth since yesterday. Pt states "yesterday the upper right side of my mouth was tingling but the pain started today and it hurts." Pt denies taking any oral medications, however did rinse mouth out with salt water and used clove oil to try to help ease pain but not effective.

## 2022-12-29 NOTE — ED Provider Notes (Signed)
[facility]    CSN: [id number] Arrival date & time: 12/29/22  1334      History   Chief Complaint Chief Complaint  Patient presents with   Dental Pain    HPI Rebecca Fritz is a 47 y.o. female.   Rebecca Fritz is a 47 y.o. female presenting for chief complaint of Dental Pain that started 2 days ago after she ate hard tacos.  She believes that one of the hard taco shells scraped her upper mouth causing dental infection.  There is a visible abscess to the gumline of the right front tooth that has not started draining.  She denies fever, chills, sore throat, viral URI symptoms, and bodyaches. Current everyday cigarette smoker, denies other drug use. No recent antibiotic/steroid use. She does not have dental insurance and does not have routine dental cleanings.  She has not attempted use of any OTC medicines PTA.      Past Medical History:  Diagnosis Date   Hidradenitis    Hidradenitis     There are no problems to display for this patient.   History reviewed. No pertinent surgical history.  OB History   No obstetric history on file.      Home Medications    Prior to Admission medications   Medication Sig Start Date End Date Taking? Authorizing Provider  amoxicillin-clavulanate (AUGMENTIN) 875-125 MG tablet Take 1 tablet by mouth every 12 (twelve) hours. 12/29/22  Yes Carlisle Beers, FNP  acetaminophen (TYLENOL) 500 MG tablet Take 500-1,000 mg by mouth every 6 (six) hours as needed for moderate pain.    [provider]  cyclobenzaprine (FLEXERIL) 10 MG tablet Take 1 tablet (10 mg total) by mouth 2 (two) times daily as needed for muscle spasms. 06/16/17   Fayrene Helper, PA-C  diphenhydrAMINE (BENADRYL) 25 MG tablet Take 1 tablet (25 mg total) by mouth every 6 (six) hours. 08/11/11 09/10/11  Ivonne Andrew, PA-C  sulfamethoxazole-trimethoprim (BACTRIM DS) 800-160 MG tablet Take 1 tablet by mouth 2 (two) times daily. 09/11/20   Gilda Crease, MD    Family History History reviewed. No pertinent family history.  Social History Social History   Tobacco Use   Smoking status: Every Day    Current packs/day: 0.50    Types: Cigarettes   Smokeless tobacco: Never  Vaping Use   Vaping status: Never Used  Substance Use Topics   Alcohol use: Not Currently   Drug use: Not Currently    Types: Marijuana     Allergies   Indomethacin   Review of Systems Review of Systems Per HPI  Physical Exam Triage Vital Signs ED Triage Vitals  Encounter Vitals Group     BP 12/29/22 1458 115/86     Systolic BP Percentile --      Diastolic BP Percentile --      Pulse Rate 12/29/22 1458 79     Resp 12/29/22 1458 18     Temp 12/29/22 1458 98 F (36.7 C)     Temp Source 12/29/22 1458 Oral     SpO2 12/29/22 1458 95 %     Weight --      Height --      Head Circumference --      Peak Flow --      Pain Score 12/29/22 1500 8     Pain Loc --      Pain Education --      Exclude from Growth Chart --  No data found.  Updated Vital Signs BP 115/86 (BP Location: Left Arm)   Pulse 79   Temp 98 F (36.7 C) (Oral)   Resp 18   SpO2 95%   Visual Acuity Right Eye Distance:   Left Eye Distance:   Bilateral Distance:    Right Eye Near:   Left Eye Near:    Bilateral Near:     Physical Exam Vitals and nursing note reviewed.  Constitutional:      Appearance: She is not ill-appearing or toxic-appearing.  HENT:     Head: Normocephalic and atraumatic.     Right Ear: Hearing and external ear normal.     Left Ear: Hearing and external ear normal.     Nose: Nose normal.     Mouth/Throat:     Lips: Pink.     Mouth: Mucous membranes are moist. No injury.     Dentition: Abnormal dentition. Dental abscesses present.     Tongue: No lesions. Tongue does not deviate from midline.     Palate: No mass and lesions.     Pharynx: Oropharynx is clear. Uvula midline. No pharyngeal swelling, oropharyngeal exudate, posterior  oropharyngeal erythema or uvula swelling.     Tonsils: No tonsillar exudate or tonsillar abscesses.     Comments: Dental abscess visualized to the right upper gumline of the right front tooth as seen in image below. Fluctuant, very tender. Non-draining.  Eyes:     General: Lids are normal. Vision grossly intact. Gaze aligned appropriately.     Extraocular Movements: Extraocular movements intact.     Conjunctiva/sclera: Conjunctivae normal.  Pulmonary:     Effort: Pulmonary effort is normal.  Musculoskeletal:     Cervical back: Neck supple.  Skin:    General: Skin is warm and dry.     Capillary Refill: Capillary refill takes less than 2 seconds.     Findings: No rash.  Neurological:     General: No focal deficit present.     Mental Status: She is alert and oriented to person, place, and time. Mental status is at baseline.     Cranial Nerves: No dysarthria or facial asymmetry.  Psychiatric:        Mood and Affect: Mood normal.        Speech: Speech normal.        Behavior: Behavior normal.        Thought Content: Thought content normal.        Judgment: Judgment normal.    Dental abscess   UC Treatments / Results  Labs (all labs ordered are listed, but only abnormal results are displayed) Labs Reviewed - No data to display  EKG   Radiology No results found.  Procedures Procedures (including critical care time)  Medications Ordered in UC Medications - No data to display  Initial Impression / Assessment and Plan / UC Course  I have reviewed the triage vital signs and the nursing notes.  Pertinent labs & imaging results that were available during my care of the patient were reviewed by me and considered in my medical decision making (see chart for details).   1. Dental abscess Evaluation suggests dental pain secondary to dental infection/abscess. HEENT exam stable and without red flag signs indicating need for advanced imaging/further emergent workup. Will manage  this with Augmentin antibiotic and over the counter medications as needed for pain and inflammation/swelling.  Patient is afebrile, nontoxic in appearance, and with hemodynamically stable vital signs.  Information for low cost community  dental resources provided.  Encouraged to follow-up with dentist for further management.   Counseled patient on potential for adverse effects with medications prescribed/recommended today, strict ER and return-to-clinic precautions discussed, patient verbalized understanding.    Final Clinical Impressions(s) / UC Diagnoses   Final diagnoses:  Dental abscess     Discharge Instructions      Your dental pain is likely due to dental infection. Take  antibiotic as prescribed for the next 7 days to treat your dental infection. Continue use of ibuprofen as needed with food for dental inflammation and pain.   You may also use tylenol as needed for pain. Perform salt water gargles every 3-4 hours.  Schedule an appointment with one of the dentists on the list provided to urgent care today.  If you develop any new or worsening symptoms or if your symptoms do not start to improve, pleases return here or follow-up with your primary care provider. If your symptoms are severe, please go to the emergency room.   ED Prescriptions     Medication Sig Dispense Auth. Provider   amoxicillin-clavulanate (AUGMENTIN) 875-125 MG tablet Take 1 tablet by mouth every 12 (twelve) hours. 14 tablet Carlisle Beers, FNP      PDMP not reviewed this encounter.   Carlisle Beers, [place] 12/29/22 1529

## 2022-12-29 NOTE — Discharge Instructions (Signed)
Your dental pain is likely due to dental infection. Take  antibiotic as prescribed for the next 7 days to treat your dental infection. Continue use of ibuprofen as needed with food for dental inflammation and pain.   You may also use tylenol as needed for pain. Perform salt water gargles every 3-4 hours.  Schedule an appointment with one of the dentists on the list provided to urgent care today.  If you develop any new or worsening symptoms or if your symptoms do not start to improve, pleases return here or follow-up with your primary care provider. If your symptoms are severe, please go to the emergency room.

## 2023-10-19 ENCOUNTER — Emergency Department (HOSPITAL_COMMUNITY)
Admission: EM | Admit: 2023-10-19 | Discharge: 2023-10-19 | Disposition: A | Discharge status: Home / Self Care | Payer: Self-pay | Attending: Emergency Medicine | Admitting: Emergency Medicine

## 2023-10-19 DIAGNOSIS — L02412 Cutaneous abscess of left axilla: Secondary | ICD-10-CM | POA: Insufficient documentation

## 2023-10-19 DIAGNOSIS — F17210 Nicotine dependence, cigarettes, uncomplicated: Secondary | ICD-10-CM | POA: Insufficient documentation

## 2023-10-19 MED ORDER — DOXYCYCLINE HYCLATE 100 MG PO CAPS: 100.0000 mg | ORAL_CAPSULE | Freq: Two times a day (BID) | ORAL | 0 refills | Status: DC

## 2023-10-19 MED ORDER — OXYCODONE-ACETAMINOPHEN 5-325 MG PO TABS
2.0000 | ORAL_TABLET | Freq: Once | ORAL | Status: AC
  Administered 2023-10-19: 2 via ORAL
  Filled 2023-10-19: qty 2

## 2023-10-19 MED ORDER — DOXYCYCLINE HYCLATE 100 MG PO CAPS: 100.0000 mg | ORAL_CAPSULE | Freq: Two times a day (BID) | ORAL | 0 refills | Status: AC

## 2023-10-19 MED ORDER — IBUPROFEN 600 MG PO TABS: 600.0000 mg | ORAL_TABLET | Freq: Four times a day (QID) | ORAL | 0 refills | Status: AC | PRN

## 2023-10-19 MED ORDER — LIDOCAINE HCL 1 % IJ SOLN
INTRAMUSCULAR | Status: AC
  Filled 2023-10-19: qty 20

## 2023-10-19 MED ORDER — IBUPROFEN 600 MG PO TABS: 600.0000 mg | ORAL_TABLET | Freq: Four times a day (QID) | ORAL | 0 refills | Status: DC | PRN

## 2023-10-19 NOTE — Discharge Instructions (Addendum)
 We evaluated you for your abscess.  We drained this in the emergency department.  Please apply warm compresses to help with drainage.  You can apply warm compresses for 15 to 20 minutes 4-5 times daily.  Please take Tylenol  (acetaminophen ) and Motrin  (ibuprofen ) for your symptoms at home.  You can take 1000 mg of Tylenol  every 6 hours and 600 mg of Motrin  every 6 hours as needed for your symptoms.  You can take these medicines together as needed, either at the same time, or alternating every 3 hours.  Please follow-up with your primary doctor, if you do not have a primary doctor you can call: [facility].  If you have any worsening symptoms such as recurrent abscess, increased swelling, increasing pain, or any other new symptoms, please return to the emergency department.

## 2023-10-19 NOTE — ED Provider Notes (Signed)
 [name] EMERGENCY DEPARTMENT AT [facility] Provider Note  CSN: [id number] Arrival date & time: 10/19/23 1932  Chief Complaint(s) Abscess  HPI Rebecca Fritz is a 48 y.o. female presenting with left axillary swelling. Patient denies history of hidradenitis but it is listed in her chart. Began swelling a few days ago. Reports pain. No fevers or chills. Reports similar episode. No chest pain or difficulty breathing   Past Medical History Past Medical History:  Diagnosis Date   Hidradenitis    Hidradenitis    There are no active problems to display for this patient.  Home Medication(s) Prior to Admission medications   Medication Sig Start Date End Date Taking? Authorizing Provider  acetaminophen  (TYLENOL ) 500 MG tablet Take 500-1,000 mg by mouth every 6 (six) hours as needed for moderate pain.    [provider]  cyclobenzaprine  (FLEXERIL ) 10 MG tablet Take 1 tablet (10 mg total) by mouth 2 (two) times daily as needed for muscle spasms. 06/16/17   Nivia Colon, PA-C  diphenhydrAMINE  (BENADRYL ) 25 MG tablet Take 1 tablet (25 mg total) by mouth every 6 (six) hours. 08/11/11 09/10/11  Dammen, Peter, PA-C  doxycycline  (VIBRAMYCIN ) 100 MG capsule Take 1 capsule (100 mg total) by mouth 2 (two) times daily for 7 days. 10/19/23 10/26/23  Francesca Elsie CROME, MD  ibuprofen  (ADVIL ) 600 MG tablet Take 1 tablet (600 mg total) by mouth every 6 (six) hours as needed. 10/19/23   Francesca Elsie CROME, MD                                                                                                                                    Past Surgical History No past surgical history on file. Family History No family history on file.  Social History Social History   Tobacco Use   Smoking status: Every Day    Current packs/day: 0.50    Types: Cigarettes   Smokeless tobacco: Never  Vaping Use   Vaping status: Never Used  Substance Use Topics   Alcohol use: Not Currently   Drug  use: Not Currently    Types: Marijuana   Allergies Indomethacin   Review of Systems Review of Systems  All other systems reviewed and are negative.   Physical Exam Vital Signs  I have reviewed the triage vital signs BP (!) 134/98   Pulse 79   Temp 98.3 F (36.8 C)   Resp 18   Ht 5' 6 (1.676 m)   Wt 89.8 kg   SpO2 97%   BMI 31.96 kg/m  Physical Exam Vitals and nursing note reviewed.  Constitutional:      Appearance: Normal appearance.  HENT:     Head: Normocephalic and atraumatic.     Mouth/Throat:     Mouth: Mucous membranes are moist.  Eyes:     Conjunctiva/sclera: Conjunctivae normal.  Cardiovascular:     Rate and Rhythm: Normal rate.  Pulmonary:  Effort: Pulmonary effort is normal. No respiratory distress.  Abdominal:     General: Abdomen is flat.  Musculoskeletal:        General: No deformity.  Skin:    General: Skin is warm and dry.     Capillary Refill: Capillary refill takes less than 2 seconds.     Comments: Approximately 2x2 cm area of fluctuance and tenderness in superficial left axilla  Neurological:     General: No focal deficit present.     Mental Status: She is alert. Mental status is at baseline.  Psychiatric:        Mood and Affect: Mood normal.        Behavior: Behavior normal.     ED Results and Treatments Labs (all labs ordered are listed, but only abnormal results are displayed) Labs Reviewed - No data to display                                                                                                                        Radiology No results found.  Pertinent labs & imaging results that were available during my care of the patient were reviewed by me and considered in my medical decision making (see MDM for details).  Medications Ordered in ED Medications  lidocaine  (XYLOCAINE ) 1 % (with pres) injection (  Given by Other 10/19/23 2317)  oxyCODONE -acetaminophen  (PERCOCET/ROXICET) 5-325 MG per tablet 2 tablet (2 tablets  Oral Given 10/19/23 2317)                                                                                                                                     Procedures .Incision and Drainage  Date/Time: 10/20/2023 1:13 PM  Performed by: Francesca Elsie CROME, MD Authorized by: Francesca Elsie CROME, MD   Consent:    Consent obtained:  Verbal   Consent given by:  Patient   Risks, benefits, and alternatives were discussed: yes     Risks discussed:  Bleeding, damage to other organs, infection, incomplete drainage and pain   Alternatives discussed:  Alternative treatment Universal protocol:    Patient identity confirmed:  Verbally with patient Location:    Type:  Abscess   Location:  Trunk   Trunk location: L axilla. Pre-procedure details:    Skin preparation:  Povidone-iodine Sedation:    Sedation type:  None Anesthesia:    Anesthesia method:  Local infiltration   Local  anesthetic:  Lidocaine  1% w/o epi Procedure type:    Complexity:  Complex Procedure details:    Incision types:  Single straight   Incision depth:  Subcutaneous   Wound management:  Probed and deloculated   Drainage:  Bloody and purulent   Drainage amount:  Copious   Wound treatment:  Wound left open Post-procedure details:    Procedure completion:  Tolerated well, no immediate complications   (including critical care time)  Medical Decision Making / ED Course   MDM:  48 y/o with left axillary swelling  Clinically seems consistent with abscess or hydradenitis. Patient has hydradenitis listed in chart but denies knowledge of this. Given significant discomfort I&D was performed with significant purulent drainage. Will prescribe doxycycline . Advised PMD follow up and return precautions for any worsening.        Additional history obtained: -Additional history obtained from family -External records from outside source obtained and reviewed including: Chart review including previous notes, labs, imaging,  consultation notes including prior notes     Medicines ordered and prescription drug management: Meds ordered this encounter  Medications   lidocaine  (XYLOCAINE ) 1 % (with pres) injection    Laciv[employer], Holly R: cabinet override   oxyCODONE -acetaminophen  (PERCOCET/ROXICET) 5-325 MG per tablet 2 tablet    Refill:  0   DISCONTD: doxycycline  (VIBRAMYCIN ) 100 MG capsule    Sig: Take 1 capsule (100 mg total) by mouth 2 (two) times daily for 7 days.    Dispense:  14 capsule    Refill:  0   DISCONTD: ibuprofen  (ADVIL ) 600 MG tablet    Sig: Take 1 tablet (600 mg total) by mouth every 6 (six) hours as needed.    Dispense:  30 tablet    Refill:  0   doxycycline  (VIBRAMYCIN ) 100 MG capsule    Sig: Take 1 capsule (100 mg total) by mouth 2 (two) times daily for 7 days.    Dispense:  14 capsule    Refill:  0   ibuprofen  (ADVIL ) 600 MG tablet    Sig: Take 1 tablet (600 mg total) by mouth every 6 (six) hours as needed.    Dispense:  30 tablet    Refill:  0    -I have reviewed the patients home medicines and have made adjustments as needed Social Determinants of Health:  Diagnosis or treatment significantly limited by social determinants of health: obesity   Reevaluation: After the interventions noted above, I reevaluated the patient and found that their symptoms have improved  Co morbidities that complicate the patient evaluation  Past Medical History:  Diagnosis Date   Hidradenitis    Hidradenitis       Dispostion: Disposition decision including need for hospitalization was considered, and patient discharged from emergency department.    Final Clinical Impression(s) / ED Diagnoses Final diagnoses:  Abscess of left axilla     This chart was dictated using voice recognition software.  Despite best efforts to proofread,  errors can occur which can change the documentation meaning.    Francesca Elsie CROME, MD 10/20/23 [phone]

## 2023-10-19 NOTE — ED Triage Notes (Signed)
 Patient arrived with complaint of an abscess under the left arm, declines any drainage. Using warm compress with no relief.
